# Patient Record
Sex: Female | Born: 1950 | Race: Black or African American | Hispanic: No | Marital: Married | Smoking: Never smoker
Health system: Southern US, Community
[De-identification: ages and names within clinical notes are randomized; demographics above are authoritative.]

## PROBLEM LIST (undated history)

## (undated) DIAGNOSIS — R0681 Apnea, not elsewhere classified: Secondary | ICD-10-CM

## (undated) DIAGNOSIS — I10 Essential (primary) hypertension: Secondary | ICD-10-CM

## (undated) DIAGNOSIS — R251 Tremor, unspecified: Secondary | ICD-10-CM

## (undated) HISTORY — DX: Tremor, unspecified: R25.1

## (undated) HISTORY — PX: APPENDECTOMY: SHX54

## (undated) HISTORY — DX: Essential (primary) hypertension: I10

## (undated) HISTORY — DX: Apnea, not elsewhere classified: R06.81

---

## 1985-06-23 HISTORY — PX: TOTAL ABDOMINAL HYSTERECTOMY: SHX209

## 2000-09-15 ENCOUNTER — Ambulatory Visit (HOSPITAL_COMMUNITY): Admission: RE | Admit: 2000-09-15 | Discharge: 2000-09-15 | Payer: Self-pay | Admitting: Cardiovascular Disease

## 2002-06-23 HISTORY — PX: MENISCUS REPAIR: SHX5179

## 2010-06-23 HISTORY — PX: OTHER SURGICAL HISTORY: SHX169

## 2014-03-13 DIAGNOSIS — M545 Low back pain, unspecified: Secondary | ICD-10-CM | POA: Diagnosis not present

## 2014-03-13 DIAGNOSIS — J309 Allergic rhinitis, unspecified: Secondary | ICD-10-CM | POA: Diagnosis not present

## 2014-03-30 DIAGNOSIS — G4733 Obstructive sleep apnea (adult) (pediatric): Secondary | ICD-10-CM | POA: Diagnosis not present

## 2014-05-25 DIAGNOSIS — M7551 Bursitis of right shoulder: Secondary | ICD-10-CM | POA: Diagnosis not present

## 2014-07-03 DIAGNOSIS — G4733 Obstructive sleep apnea (adult) (pediatric): Secondary | ICD-10-CM | POA: Diagnosis not present

## 2014-07-10 DIAGNOSIS — H40013 Open angle with borderline findings, low risk, bilateral: Secondary | ICD-10-CM | POA: Diagnosis not present

## 2014-07-10 DIAGNOSIS — H2513 Age-related nuclear cataract, bilateral: Secondary | ICD-10-CM | POA: Diagnosis not present

## 2014-07-27 DIAGNOSIS — H53433 Sector or arcuate defects, bilateral: Secondary | ICD-10-CM | POA: Diagnosis not present

## 2014-08-01 DIAGNOSIS — H4011X3 Primary open-angle glaucoma, severe stage: Secondary | ICD-10-CM | POA: Diagnosis not present

## 2014-09-21 DIAGNOSIS — Z1389 Encounter for screening for other disorder: Secondary | ICD-10-CM | POA: Diagnosis not present

## 2014-09-21 DIAGNOSIS — M79672 Pain in left foot: Secondary | ICD-10-CM | POA: Diagnosis not present

## 2014-09-21 DIAGNOSIS — M79671 Pain in right foot: Secondary | ICD-10-CM | POA: Diagnosis not present

## 2014-09-21 DIAGNOSIS — Z9181 History of falling: Secondary | ICD-10-CM | POA: Diagnosis not present

## 2014-12-18 DIAGNOSIS — E669 Obesity, unspecified: Secondary | ICD-10-CM | POA: Diagnosis not present

## 2014-12-18 DIAGNOSIS — Z23 Encounter for immunization: Secondary | ICD-10-CM | POA: Diagnosis not present

## 2014-12-18 DIAGNOSIS — I10 Essential (primary) hypertension: Secondary | ICD-10-CM | POA: Diagnosis not present

## 2014-12-18 DIAGNOSIS — E039 Hypothyroidism, unspecified: Secondary | ICD-10-CM | POA: Diagnosis not present

## 2014-12-18 DIAGNOSIS — G40309 Generalized idiopathic epilepsy and epileptic syndromes, not intractable, without status epilepticus: Secondary | ICD-10-CM | POA: Diagnosis not present

## 2014-12-18 DIAGNOSIS — G4733 Obstructive sleep apnea (adult) (pediatric): Secondary | ICD-10-CM | POA: Diagnosis not present

## 2014-12-18 DIAGNOSIS — Z79899 Other long term (current) drug therapy: Secondary | ICD-10-CM | POA: Diagnosis not present

## 2014-12-19 DIAGNOSIS — Z1211 Encounter for screening for malignant neoplasm of colon: Secondary | ICD-10-CM | POA: Diagnosis not present

## 2014-12-19 DIAGNOSIS — R1310 Dysphagia, unspecified: Secondary | ICD-10-CM | POA: Diagnosis not present

## 2014-12-19 DIAGNOSIS — K5900 Constipation, unspecified: Secondary | ICD-10-CM | POA: Diagnosis not present

## 2014-12-19 DIAGNOSIS — Z7982 Long term (current) use of aspirin: Secondary | ICD-10-CM | POA: Diagnosis not present

## 2014-12-20 DIAGNOSIS — D649 Anemia, unspecified: Secondary | ICD-10-CM | POA: Diagnosis not present

## 2014-12-20 DIAGNOSIS — Z1231 Encounter for screening mammogram for malignant neoplasm of breast: Secondary | ICD-10-CM | POA: Diagnosis not present

## 2014-12-22 DIAGNOSIS — C50919 Malignant neoplasm of unspecified site of unspecified female breast: Secondary | ICD-10-CM

## 2014-12-22 HISTORY — PX: BREAST BIOPSY: SHX20

## 2014-12-22 HISTORY — DX: Malignant neoplasm of unspecified site of unspecified female breast: C50.919

## 2014-12-28 DIAGNOSIS — Z803 Family history of malignant neoplasm of breast: Secondary | ICD-10-CM | POA: Diagnosis not present

## 2014-12-28 DIAGNOSIS — R920 Mammographic microcalcification found on diagnostic imaging of breast: Secondary | ICD-10-CM | POA: Diagnosis not present

## 2015-01-01 DIAGNOSIS — R921 Mammographic calcification found on diagnostic imaging of breast: Secondary | ICD-10-CM | POA: Diagnosis not present

## 2015-01-01 DIAGNOSIS — C50911 Malignant neoplasm of unspecified site of right female breast: Secondary | ICD-10-CM | POA: Diagnosis not present

## 2015-01-01 DIAGNOSIS — C50311 Malignant neoplasm of lower-inner quadrant of right female breast: Secondary | ICD-10-CM | POA: Diagnosis not present

## 2015-01-10 DIAGNOSIS — C50111 Malignant neoplasm of central portion of right female breast: Secondary | ICD-10-CM | POA: Diagnosis not present

## 2015-01-10 DIAGNOSIS — Z6841 Body Mass Index (BMI) 40.0 and over, adult: Secondary | ICD-10-CM | POA: Diagnosis not present

## 2015-01-22 DIAGNOSIS — R1310 Dysphagia, unspecified: Secondary | ICD-10-CM | POA: Diagnosis not present

## 2015-01-22 DIAGNOSIS — K2900 Acute gastritis without bleeding: Secondary | ICD-10-CM | POA: Diagnosis not present

## 2015-01-22 DIAGNOSIS — K319 Disease of stomach and duodenum, unspecified: Secondary | ICD-10-CM | POA: Diagnosis not present

## 2015-01-22 DIAGNOSIS — Z791 Long term (current) use of non-steroidal anti-inflammatories (NSAID): Secondary | ICD-10-CM | POA: Diagnosis not present

## 2015-01-22 DIAGNOSIS — Z1211 Encounter for screening for malignant neoplasm of colon: Secondary | ICD-10-CM | POA: Diagnosis not present

## 2015-02-07 DIAGNOSIS — C50911 Malignant neoplasm of unspecified site of right female breast: Secondary | ICD-10-CM | POA: Diagnosis not present

## 2015-02-07 DIAGNOSIS — D0511 Intraductal carcinoma in situ of right breast: Secondary | ICD-10-CM | POA: Diagnosis not present

## 2015-02-07 DIAGNOSIS — G4733 Obstructive sleep apnea (adult) (pediatric): Secondary | ICD-10-CM | POA: Diagnosis not present

## 2015-02-07 DIAGNOSIS — Z6841 Body Mass Index (BMI) 40.0 and over, adult: Secondary | ICD-10-CM | POA: Diagnosis not present

## 2015-02-07 DIAGNOSIS — J45909 Unspecified asthma, uncomplicated: Secondary | ICD-10-CM | POA: Diagnosis not present

## 2015-02-07 DIAGNOSIS — I493 Ventricular premature depolarization: Secondary | ICD-10-CM | POA: Diagnosis not present

## 2015-02-07 DIAGNOSIS — C50311 Malignant neoplasm of lower-inner quadrant of right female breast: Secondary | ICD-10-CM | POA: Diagnosis not present

## 2015-02-07 DIAGNOSIS — I10 Essential (primary) hypertension: Secondary | ICD-10-CM | POA: Diagnosis not present

## 2015-02-07 DIAGNOSIS — Z7982 Long term (current) use of aspirin: Secondary | ICD-10-CM | POA: Diagnosis not present

## 2015-02-07 DIAGNOSIS — E039 Hypothyroidism, unspecified: Secondary | ICD-10-CM | POA: Diagnosis not present

## 2015-02-07 DIAGNOSIS — Z79899 Other long term (current) drug therapy: Secondary | ICD-10-CM | POA: Diagnosis not present

## 2015-02-07 DIAGNOSIS — G4730 Sleep apnea, unspecified: Secondary | ICD-10-CM | POA: Diagnosis not present

## 2015-02-07 DIAGNOSIS — C50111 Malignant neoplasm of central portion of right female breast: Secondary | ICD-10-CM | POA: Diagnosis not present

## 2015-02-08 DIAGNOSIS — J45909 Unspecified asthma, uncomplicated: Secondary | ICD-10-CM | POA: Diagnosis not present

## 2015-02-08 DIAGNOSIS — I10 Essential (primary) hypertension: Secondary | ICD-10-CM | POA: Diagnosis not present

## 2015-02-08 DIAGNOSIS — C50311 Malignant neoplasm of lower-inner quadrant of right female breast: Secondary | ICD-10-CM | POA: Diagnosis not present

## 2015-02-08 DIAGNOSIS — G4733 Obstructive sleep apnea (adult) (pediatric): Secondary | ICD-10-CM | POA: Diagnosis not present

## 2015-02-08 DIAGNOSIS — E039 Hypothyroidism, unspecified: Secondary | ICD-10-CM | POA: Diagnosis not present

## 2015-02-28 DIAGNOSIS — C50111 Malignant neoplasm of central portion of right female breast: Secondary | ICD-10-CM | POA: Diagnosis not present

## 2015-03-02 DIAGNOSIS — C50111 Malignant neoplasm of central portion of right female breast: Secondary | ICD-10-CM | POA: Diagnosis not present

## 2015-03-07 DIAGNOSIS — C50111 Malignant neoplasm of central portion of right female breast: Secondary | ICD-10-CM | POA: Diagnosis not present

## 2015-03-07 DIAGNOSIS — Z510 Encounter for antineoplastic radiation therapy: Secondary | ICD-10-CM | POA: Diagnosis not present

## 2015-03-09 DIAGNOSIS — C50111 Malignant neoplasm of central portion of right female breast: Secondary | ICD-10-CM | POA: Diagnosis not present

## 2015-03-09 DIAGNOSIS — Z510 Encounter for antineoplastic radiation therapy: Secondary | ICD-10-CM | POA: Diagnosis not present

## 2015-03-12 DIAGNOSIS — C50111 Malignant neoplasm of central portion of right female breast: Secondary | ICD-10-CM | POA: Diagnosis not present

## 2015-03-13 DIAGNOSIS — C50111 Malignant neoplasm of central portion of right female breast: Secondary | ICD-10-CM | POA: Diagnosis not present

## 2015-03-13 DIAGNOSIS — Z510 Encounter for antineoplastic radiation therapy: Secondary | ICD-10-CM | POA: Diagnosis not present

## 2015-03-14 DIAGNOSIS — Z510 Encounter for antineoplastic radiation therapy: Secondary | ICD-10-CM | POA: Diagnosis not present

## 2015-03-14 DIAGNOSIS — C50111 Malignant neoplasm of central portion of right female breast: Secondary | ICD-10-CM | POA: Diagnosis not present

## 2015-03-15 DIAGNOSIS — Z510 Encounter for antineoplastic radiation therapy: Secondary | ICD-10-CM | POA: Diagnosis not present

## 2015-03-15 DIAGNOSIS — C50111 Malignant neoplasm of central portion of right female breast: Secondary | ICD-10-CM | POA: Diagnosis not present

## 2015-03-16 DIAGNOSIS — Z510 Encounter for antineoplastic radiation therapy: Secondary | ICD-10-CM | POA: Diagnosis not present

## 2015-03-16 DIAGNOSIS — C50111 Malignant neoplasm of central portion of right female breast: Secondary | ICD-10-CM | POA: Diagnosis not present

## 2015-03-19 DIAGNOSIS — C50111 Malignant neoplasm of central portion of right female breast: Secondary | ICD-10-CM | POA: Diagnosis not present

## 2015-03-19 DIAGNOSIS — Z510 Encounter for antineoplastic radiation therapy: Secondary | ICD-10-CM | POA: Diagnosis not present

## 2015-03-20 DIAGNOSIS — C50111 Malignant neoplasm of central portion of right female breast: Secondary | ICD-10-CM | POA: Diagnosis not present

## 2015-03-20 DIAGNOSIS — Z510 Encounter for antineoplastic radiation therapy: Secondary | ICD-10-CM | POA: Diagnosis not present

## 2015-03-21 DIAGNOSIS — Z510 Encounter for antineoplastic radiation therapy: Secondary | ICD-10-CM | POA: Diagnosis not present

## 2015-03-21 DIAGNOSIS — C50111 Malignant neoplasm of central portion of right female breast: Secondary | ICD-10-CM | POA: Diagnosis not present

## 2015-03-22 DIAGNOSIS — C50111 Malignant neoplasm of central portion of right female breast: Secondary | ICD-10-CM | POA: Diagnosis not present

## 2015-03-22 DIAGNOSIS — Z510 Encounter for antineoplastic radiation therapy: Secondary | ICD-10-CM | POA: Diagnosis not present

## 2015-03-23 DIAGNOSIS — Z510 Encounter for antineoplastic radiation therapy: Secondary | ICD-10-CM | POA: Diagnosis not present

## 2015-03-23 DIAGNOSIS — C50111 Malignant neoplasm of central portion of right female breast: Secondary | ICD-10-CM | POA: Diagnosis not present

## 2015-03-26 DIAGNOSIS — Z510 Encounter for antineoplastic radiation therapy: Secondary | ICD-10-CM | POA: Diagnosis not present

## 2015-03-26 DIAGNOSIS — C50111 Malignant neoplasm of central portion of right female breast: Secondary | ICD-10-CM | POA: Diagnosis not present

## 2015-03-27 DIAGNOSIS — C50111 Malignant neoplasm of central portion of right female breast: Secondary | ICD-10-CM | POA: Diagnosis not present

## 2015-03-27 DIAGNOSIS — Z510 Encounter for antineoplastic radiation therapy: Secondary | ICD-10-CM | POA: Diagnosis not present

## 2015-03-28 DIAGNOSIS — Z510 Encounter for antineoplastic radiation therapy: Secondary | ICD-10-CM | POA: Diagnosis not present

## 2015-03-28 DIAGNOSIS — C50111 Malignant neoplasm of central portion of right female breast: Secondary | ICD-10-CM | POA: Diagnosis not present

## 2015-03-29 DIAGNOSIS — Z510 Encounter for antineoplastic radiation therapy: Secondary | ICD-10-CM | POA: Diagnosis not present

## 2015-03-29 DIAGNOSIS — C50111 Malignant neoplasm of central portion of right female breast: Secondary | ICD-10-CM | POA: Diagnosis not present

## 2015-03-30 DIAGNOSIS — Z510 Encounter for antineoplastic radiation therapy: Secondary | ICD-10-CM | POA: Diagnosis not present

## 2015-03-30 DIAGNOSIS — C50111 Malignant neoplasm of central portion of right female breast: Secondary | ICD-10-CM | POA: Diagnosis not present

## 2015-04-02 DIAGNOSIS — C50111 Malignant neoplasm of central portion of right female breast: Secondary | ICD-10-CM | POA: Diagnosis not present

## 2015-04-02 DIAGNOSIS — Z510 Encounter for antineoplastic radiation therapy: Secondary | ICD-10-CM | POA: Diagnosis not present

## 2015-04-03 DIAGNOSIS — Z510 Encounter for antineoplastic radiation therapy: Secondary | ICD-10-CM | POA: Diagnosis not present

## 2015-04-03 DIAGNOSIS — C50111 Malignant neoplasm of central portion of right female breast: Secondary | ICD-10-CM | POA: Diagnosis not present

## 2015-04-04 DIAGNOSIS — H40013 Open angle with borderline findings, low risk, bilateral: Secondary | ICD-10-CM | POA: Diagnosis not present

## 2015-04-04 DIAGNOSIS — Z510 Encounter for antineoplastic radiation therapy: Secondary | ICD-10-CM | POA: Diagnosis not present

## 2015-04-04 DIAGNOSIS — C50111 Malignant neoplasm of central portion of right female breast: Secondary | ICD-10-CM | POA: Diagnosis not present

## 2015-04-05 DIAGNOSIS — C50111 Malignant neoplasm of central portion of right female breast: Secondary | ICD-10-CM | POA: Diagnosis not present

## 2015-04-05 DIAGNOSIS — Z510 Encounter for antineoplastic radiation therapy: Secondary | ICD-10-CM | POA: Diagnosis not present

## 2015-04-06 DIAGNOSIS — C50111 Malignant neoplasm of central portion of right female breast: Secondary | ICD-10-CM | POA: Diagnosis not present

## 2015-04-06 DIAGNOSIS — Z510 Encounter for antineoplastic radiation therapy: Secondary | ICD-10-CM | POA: Diagnosis not present

## 2015-04-09 DIAGNOSIS — Z510 Encounter for antineoplastic radiation therapy: Secondary | ICD-10-CM | POA: Diagnosis not present

## 2015-04-09 DIAGNOSIS — C50111 Malignant neoplasm of central portion of right female breast: Secondary | ICD-10-CM | POA: Diagnosis not present

## 2015-04-10 DIAGNOSIS — Z510 Encounter for antineoplastic radiation therapy: Secondary | ICD-10-CM | POA: Diagnosis not present

## 2015-04-10 DIAGNOSIS — C50111 Malignant neoplasm of central portion of right female breast: Secondary | ICD-10-CM | POA: Diagnosis not present

## 2015-04-11 DIAGNOSIS — Z510 Encounter for antineoplastic radiation therapy: Secondary | ICD-10-CM | POA: Diagnosis not present

## 2015-04-11 DIAGNOSIS — C50111 Malignant neoplasm of central portion of right female breast: Secondary | ICD-10-CM | POA: Diagnosis not present

## 2015-04-12 DIAGNOSIS — Z510 Encounter for antineoplastic radiation therapy: Secondary | ICD-10-CM | POA: Diagnosis not present

## 2015-04-12 DIAGNOSIS — C50111 Malignant neoplasm of central portion of right female breast: Secondary | ICD-10-CM | POA: Diagnosis not present

## 2015-04-13 DIAGNOSIS — C50111 Malignant neoplasm of central portion of right female breast: Secondary | ICD-10-CM | POA: Diagnosis not present

## 2015-04-13 DIAGNOSIS — Z510 Encounter for antineoplastic radiation therapy: Secondary | ICD-10-CM | POA: Diagnosis not present

## 2015-04-16 DIAGNOSIS — C50111 Malignant neoplasm of central portion of right female breast: Secondary | ICD-10-CM | POA: Diagnosis not present

## 2015-04-16 DIAGNOSIS — Z510 Encounter for antineoplastic radiation therapy: Secondary | ICD-10-CM | POA: Diagnosis not present

## 2015-04-17 DIAGNOSIS — Z510 Encounter for antineoplastic radiation therapy: Secondary | ICD-10-CM | POA: Diagnosis not present

## 2015-04-17 DIAGNOSIS — C50111 Malignant neoplasm of central portion of right female breast: Secondary | ICD-10-CM | POA: Diagnosis not present

## 2015-04-18 DIAGNOSIS — C50111 Malignant neoplasm of central portion of right female breast: Secondary | ICD-10-CM | POA: Diagnosis not present

## 2015-04-18 DIAGNOSIS — Z510 Encounter for antineoplastic radiation therapy: Secondary | ICD-10-CM | POA: Diagnosis not present

## 2015-04-20 DIAGNOSIS — C50111 Malignant neoplasm of central portion of right female breast: Secondary | ICD-10-CM | POA: Diagnosis not present

## 2015-04-20 DIAGNOSIS — Z510 Encounter for antineoplastic radiation therapy: Secondary | ICD-10-CM | POA: Diagnosis not present

## 2015-04-23 DIAGNOSIS — C50111 Malignant neoplasm of central portion of right female breast: Secondary | ICD-10-CM | POA: Diagnosis not present

## 2015-04-23 DIAGNOSIS — Z510 Encounter for antineoplastic radiation therapy: Secondary | ICD-10-CM | POA: Diagnosis not present

## 2015-04-24 DIAGNOSIS — Z510 Encounter for antineoplastic radiation therapy: Secondary | ICD-10-CM | POA: Diagnosis not present

## 2015-04-24 DIAGNOSIS — C50111 Malignant neoplasm of central portion of right female breast: Secondary | ICD-10-CM | POA: Diagnosis not present

## 2015-05-24 DIAGNOSIS — C50919 Malignant neoplasm of unspecified site of unspecified female breast: Secondary | ICD-10-CM | POA: Diagnosis not present

## 2015-05-24 DIAGNOSIS — D0511 Intraductal carcinoma in situ of right breast: Secondary | ICD-10-CM | POA: Diagnosis not present

## 2015-05-24 DIAGNOSIS — Z808 Family history of malignant neoplasm of other organs or systems: Secondary | ICD-10-CM | POA: Diagnosis not present

## 2015-05-24 DIAGNOSIS — Z803 Family history of malignant neoplasm of breast: Secondary | ICD-10-CM | POA: Diagnosis not present

## 2015-07-02 DIAGNOSIS — Z853 Personal history of malignant neoplasm of breast: Secondary | ICD-10-CM | POA: Diagnosis not present

## 2015-07-02 DIAGNOSIS — C50919 Malignant neoplasm of unspecified site of unspecified female breast: Secondary | ICD-10-CM | POA: Diagnosis not present

## 2015-07-02 DIAGNOSIS — Z79811 Long term (current) use of aromatase inhibitors: Secondary | ICD-10-CM | POA: Diagnosis not present

## 2015-07-24 DIAGNOSIS — R202 Paresthesia of skin: Secondary | ICD-10-CM | POA: Diagnosis not present

## 2015-07-24 DIAGNOSIS — M722 Plantar fascial fibromatosis: Secondary | ICD-10-CM | POA: Diagnosis not present

## 2015-08-10 DIAGNOSIS — J029 Acute pharyngitis, unspecified: Secondary | ICD-10-CM | POA: Diagnosis not present

## 2015-08-10 DIAGNOSIS — H6121 Impacted cerumen, right ear: Secondary | ICD-10-CM | POA: Diagnosis not present

## 2015-08-21 DIAGNOSIS — Z6841 Body Mass Index (BMI) 40.0 and over, adult: Secondary | ICD-10-CM | POA: Diagnosis not present

## 2015-08-21 DIAGNOSIS — C50111 Malignant neoplasm of central portion of right female breast: Secondary | ICD-10-CM | POA: Insufficient documentation

## 2015-08-21 DIAGNOSIS — Z170 Estrogen receptor positive status [ER+]: Secondary | ICD-10-CM | POA: Insufficient documentation

## 2015-10-30 DIAGNOSIS — M19072 Primary osteoarthritis, left ankle and foot: Secondary | ICD-10-CM | POA: Diagnosis not present

## 2015-10-30 DIAGNOSIS — M6701 Short Achilles tendon (acquired), right ankle: Secondary | ICD-10-CM | POA: Insufficient documentation

## 2015-10-30 DIAGNOSIS — M19071 Primary osteoarthritis, right ankle and foot: Secondary | ICD-10-CM | POA: Diagnosis not present

## 2015-10-30 DIAGNOSIS — M6702 Short Achilles tendon (acquired), left ankle: Secondary | ICD-10-CM | POA: Diagnosis not present

## 2015-10-30 DIAGNOSIS — M722 Plantar fascial fibromatosis: Secondary | ICD-10-CM | POA: Diagnosis not present

## 2015-11-01 DIAGNOSIS — Z170 Estrogen receptor positive status [ER+]: Secondary | ICD-10-CM | POA: Diagnosis not present

## 2015-11-01 DIAGNOSIS — C50919 Malignant neoplasm of unspecified site of unspecified female breast: Secondary | ICD-10-CM | POA: Diagnosis not present

## 2015-11-01 DIAGNOSIS — Z79811 Long term (current) use of aromatase inhibitors: Secondary | ICD-10-CM | POA: Diagnosis not present

## 2015-11-01 DIAGNOSIS — Z853 Personal history of malignant neoplasm of breast: Secondary | ICD-10-CM | POA: Diagnosis not present

## 2015-12-04 DIAGNOSIS — M722 Plantar fascial fibromatosis: Secondary | ICD-10-CM | POA: Diagnosis not present

## 2015-12-04 DIAGNOSIS — M19071 Primary osteoarthritis, right ankle and foot: Secondary | ICD-10-CM | POA: Diagnosis not present

## 2015-12-04 DIAGNOSIS — M6701 Short Achilles tendon (acquired), right ankle: Secondary | ICD-10-CM | POA: Diagnosis not present

## 2015-12-04 DIAGNOSIS — M19072 Primary osteoarthritis, left ankle and foot: Secondary | ICD-10-CM | POA: Diagnosis not present

## 2015-12-04 DIAGNOSIS — M6702 Short Achilles tendon (acquired), left ankle: Secondary | ICD-10-CM | POA: Diagnosis not present

## 2015-12-24 DIAGNOSIS — M8589 Other specified disorders of bone density and structure, multiple sites: Secondary | ICD-10-CM | POA: Diagnosis not present

## 2015-12-24 DIAGNOSIS — Z9889 Other specified postprocedural states: Secondary | ICD-10-CM | POA: Diagnosis not present

## 2015-12-24 DIAGNOSIS — C50111 Malignant neoplasm of central portion of right female breast: Secondary | ICD-10-CM | POA: Diagnosis not present

## 2015-12-24 DIAGNOSIS — Z13820 Encounter for screening for osteoporosis: Secondary | ICD-10-CM | POA: Diagnosis not present

## 2015-12-24 DIAGNOSIS — Z853 Personal history of malignant neoplasm of breast: Secondary | ICD-10-CM | POA: Diagnosis not present

## 2016-01-08 DIAGNOSIS — Z170 Estrogen receptor positive status [ER+]: Secondary | ICD-10-CM | POA: Diagnosis not present

## 2016-01-08 DIAGNOSIS — C50111 Malignant neoplasm of central portion of right female breast: Secondary | ICD-10-CM | POA: Diagnosis not present

## 2016-01-08 DIAGNOSIS — Z6841 Body Mass Index (BMI) 40.0 and over, adult: Secondary | ICD-10-CM | POA: Insufficient documentation

## 2016-02-06 DIAGNOSIS — G40309 Generalized idiopathic epilepsy and epileptic syndromes, not intractable, without status epilepticus: Secondary | ICD-10-CM | POA: Diagnosis not present

## 2016-02-06 DIAGNOSIS — Z79899 Other long term (current) drug therapy: Secondary | ICD-10-CM | POA: Diagnosis not present

## 2016-02-06 DIAGNOSIS — E039 Hypothyroidism, unspecified: Secondary | ICD-10-CM | POA: Diagnosis not present

## 2016-02-06 DIAGNOSIS — R32 Unspecified urinary incontinence: Secondary | ICD-10-CM | POA: Diagnosis not present

## 2016-02-06 DIAGNOSIS — I10 Essential (primary) hypertension: Secondary | ICD-10-CM | POA: Diagnosis not present

## 2016-02-06 DIAGNOSIS — Z0000 Encounter for general adult medical examination without abnormal findings: Secondary | ICD-10-CM | POA: Diagnosis not present

## 2016-02-06 DIAGNOSIS — Z23 Encounter for immunization: Secondary | ICD-10-CM | POA: Diagnosis not present

## 2016-02-07 DIAGNOSIS — R32 Unspecified urinary incontinence: Secondary | ICD-10-CM | POA: Diagnosis not present

## 2016-03-03 DIAGNOSIS — Z853 Personal history of malignant neoplasm of breast: Secondary | ICD-10-CM | POA: Diagnosis not present

## 2016-03-03 DIAGNOSIS — Z79811 Long term (current) use of aromatase inhibitors: Secondary | ICD-10-CM

## 2016-03-03 DIAGNOSIS — C50111 Malignant neoplasm of central portion of right female breast: Secondary | ICD-10-CM

## 2016-03-03 DIAGNOSIS — Z170 Estrogen receptor positive status [ER+]: Secondary | ICD-10-CM

## 2016-04-03 DIAGNOSIS — K2900 Acute gastritis without bleeding: Secondary | ICD-10-CM | POA: Diagnosis not present

## 2016-04-03 DIAGNOSIS — K5900 Constipation, unspecified: Secondary | ICD-10-CM | POA: Diagnosis not present

## 2016-05-20 DIAGNOSIS — S93491A Sprain of other ligament of right ankle, initial encounter: Secondary | ICD-10-CM | POA: Diagnosis not present

## 2016-05-20 DIAGNOSIS — I10 Essential (primary) hypertension: Secondary | ICD-10-CM | POA: Diagnosis not present

## 2016-05-28 ENCOUNTER — Ambulatory Visit (INDEPENDENT_AMBULATORY_CARE_PROVIDER_SITE_OTHER): Payer: Medicare Other

## 2016-05-28 ENCOUNTER — Encounter: Payer: Self-pay | Admitting: Sports Medicine

## 2016-05-28 ENCOUNTER — Ambulatory Visit (INDEPENDENT_AMBULATORY_CARE_PROVIDER_SITE_OTHER): Payer: Medicare Other | Admitting: Sports Medicine

## 2016-05-28 DIAGNOSIS — M779 Enthesopathy, unspecified: Secondary | ICD-10-CM | POA: Diagnosis not present

## 2016-05-28 DIAGNOSIS — M2142 Flat foot [pes planus] (acquired), left foot: Secondary | ICD-10-CM | POA: Diagnosis not present

## 2016-05-28 DIAGNOSIS — M25571 Pain in right ankle and joints of right foot: Secondary | ICD-10-CM

## 2016-05-28 DIAGNOSIS — M2141 Flat foot [pes planus] (acquired), right foot: Secondary | ICD-10-CM | POA: Diagnosis not present

## 2016-05-28 DIAGNOSIS — M19071 Primary osteoarthritis, right ankle and foot: Secondary | ICD-10-CM | POA: Diagnosis not present

## 2016-05-28 DIAGNOSIS — M25532 Pain in left wrist: Secondary | ICD-10-CM | POA: Diagnosis not present

## 2016-05-28 MED ORDER — METHYLPREDNISOLONE 4 MG PO TBPK: ORAL_TABLET | ORAL | 0 refills | Status: DC

## 2016-05-28 MED ORDER — TRIAMCINOLONE ACETONIDE 40 MG/ML IJ SUSP: 20.0000 mg | Freq: Once | INTRAMUSCULAR | Status: AC

## 2016-05-28 NOTE — Progress Notes (Signed)
Subjective:  Kim Nielsen is a 65 y.o. Obese female patient who presents to office for evaluation of right ankle pain. Patient complains of continued pain in the ankle x 3 weeks. Patient has tried ice and tylenol with no relief in symptoms. Patient denies any other pedal complaints. Denies injury/trip/fall/sprain/any causative factors.   Patient Active Problem List   Diagnosis Date Noted  . BMI 40.0-44.9, adult ([place]) 01/08/2016  . Plantar fasciitis 10/30/2015  . Primary osteoarthritis of both feet 10/30/2015  . Tightness of left heel cord 10/30/2015  . Tightness of right heel cord 10/30/2015  . BMI 45.0-49.9, adult ([place]) 08/21/2015  . Cancer of central portion of right female breast ([place]) 08/21/2015  . Estrogen receptor positive tumor status 08/21/2015  . Morbid obesity ([place]) 08/21/2015    No current outpatient prescriptions on file prior to visit.   No current facility-administered medications on file prior to visit.     Allergies  Allergen Reactions  . Oxycodone     Objective:  General: Alert and oriented x3 in no acute distress  Dermatology: No open lesions bilateral lower extremities, no webspace macerations, no ecchymosis bilateral, all nails x 10 are well manicured.  Vascular: Dorsalis Pedis and Posterior Tibial pedal pulses palpable, Capillary Fill Time 3 seconds,(+) pedal hair growth bilateral, + edema and excessive fat bilateral lower extremities, Temperature gradient within normal limits.  Neurology: Gross sensation intact via light touch bilateral. (- )Tinels sign right.   Musculoskeletal: Mild tenderness with palpation at right ankle at medial and lateral ankle gutters. Negative talar tilt, Negative tib-fib stress, No instability. Severe pes planus with lateral ankle impingement right>left.  No pain with calf compression bilateral. Range of motion limited with mild guarding on right ankle. Strength within normal limits in all groups bilateral.   Gait: Antalgic  gait  Xrays  RIght Ankle   Impression: Severe arthritis, joint space narrowing, pes planus.   Assessment and Plan: Problem List Items Addressed This Visit    None    Visit Diagnoses    Capsulitis    -  Primary   Relevant Medications   methylPREDNISolone (MEDROL DOSEPAK) 4 MG TBPK tablet   triamcinolone acetonide (KENALOG-40) injection 20 mg   Right ankle pain, unspecified chronicity       Relevant Medications   methylPREDNISolone (MEDROL DOSEPAK) 4 MG TBPK tablet   triamcinolone acetonide (KENALOG-40) injection 20 mg   Other Relevant Orders   DG Ankle Complete Right   Primary osteoarthritis of right ankle       Relevant Medications   anastrozole (ARIMIDEX) 1 MG tablet   anastrozole (ARIMIDEX) 1 MG tablet   aspirin EC 81 MG tablet   meloxicam (MOBIC) 7.5 MG tablet   methylPREDNISolone (MEDROL DOSEPAK) 4 MG TBPK tablet   triamcinolone acetonide (KENALOG-40) injection 20 mg   Pes planus of both feet       Relevant Medications   methylPREDNISolone (MEDROL DOSEPAK) 4 MG TBPK tablet      -Complete examination performed -Xrays reviewed -Discussed treatement options for arthritis and capsulitis -After oral consent and aseptic prep, injected a mixture containing 1 ml of 2% plain lidocaine, 1 ml 0.5% plain marcaine, 0.5 ml of kenalog 10 and 0.5 ml of dexamethasone phosphate into right ankle at area of max pain lateral aspect without complication. Post-injection care discussed with patient.  -Rx Medrol dose pack -Advised Tylenol arthritis, rest, ice, elevation -Continue with good supportive shoes -In future may benefit from UCBL or AFO type brace -Patient to return to  office in 4 weeks or sooner if condition worsens.  Landis Martins, DPM

## 2016-05-28 NOTE — Patient Instructions (Signed)
Ankle Pain Many things can cause ankle pain, including an injury to the area and overuse of the ankle.The ankle joint holds your body weight and allows you to move around. Ankle pain can occur on either side or the back of one ankle or both ankles. Ankle pain may be sharp and burning or dull and aching. There may be tenderness, stiffness, redness, or warmth around the ankle. Follow these instructions at home: Activity  Rest your ankle as told by your health care provider. Avoid any activities that cause ankle pain.  Do exercises as told by your health care provider.  Ask your health care provider if you can drive. Using a brace, a bandage, or crutches  If you were given a brace:  Wear it as told by your health care provider.  Remove it when you take a bath or a shower.  Try not to move your ankle very much, but wiggle your toes from time to time. This helps to prevent swelling.  If you were given an elastic bandage:  Remove it when you take a bath or a shower.  Try not to move your ankle very much, but wiggle your toes from time to time. This helps to prevent swelling.  Adjust the bandage to make it more comfortable if it feels too tight.  Loosen the bandage if you have numbness or tingling in your foot or if your foot turns cold and blue.  If you have crutches, use them as told by your health care provider. Continue to use them until you can walk without feeling pain in your ankle. Managing pain, stiffness, and swelling  Raise (elevate) your ankle above the level of your heart while you are sitting or lying down.  If directed, apply ice to the area:  Put ice in a plastic bag.  Place a towel between your skin and the bag.  Leave the ice on for 20 minutes, 2-3 times per day. General instructions  Keep all follow-up visits as told by your health care provider. This is important.  Record this information that may be helpful for you and your health care provider:  How  often you have ankle pain.  Where the pain is located.  What the pain feels like.  Take over-the-counter and prescription medicines only as told by your health care provider. Contact a health care provider if:  Your pain gets worse.  Your pain is not relieved with medicines.  You have a fever or chills.  You are having more trouble with walking.  You have new symptoms. Get help right away if:  Your foot, leg, toes, or ankle tingles or becomes numb.  Your foot, leg, toes, or ankle becomes swollen.  Your foot, leg, toes, or ankle turns pale or blue. This information is not intended to replace advice given to you by your health care provider. Make sure you discuss any questions you have with your health care provider. Document Released: 11/27/2009 Document Revised: 02/08/2016 Document Reviewed: 01/09/2015 Elsevier Interactive Patient Education  2017 [employer] Flat feet, also called fallen arches, are feet that do not have a curve (arch) on the inner side of the foot. Normally, an arch develops during childhood and creates a gap between the foot and the ground. Flat feet rest entirely on the ground, without a gap. In some cases, an arch never develops. In other cases, an arch develops and later collapses.  This is a common condition that can affect one foot  or both feet. CAUSES This condition may be caused by:  Failure of normal arch development during childhood.  Injury to connective tissues (tendons and ligaments) in the foot, such as the tendon that supports the arch (posterior tibial tendon).  Loose tendons or ligaments in the foot.  A wearing down of the arch over time.  Injury to bones in the foot.  Abnormality in the bones of the foot (tarsal coalition). This is when two or more bones in the foot are joined together (fused) during development before birth. This limits foot movement and can cause flat feet. RISK FACTORS This condition is more common  in females. It is also more likely to develop in people who:  Are 40 years or older.  Have a family history of flat feet.  Have a history of childhood flexible flatfoot. This is a common childhood condition in which the arch disappears when a child is standing but is present when a child is sitting or standing on tiptoes.  Are obese.  Have diabetes.  Have high blood pressure.  Participate in high-impact sports that can damage the posterior tibial tendon.  Have inflammatory arthritis.  Have a history of broken (fractured) or dislocated bones in the foot. SYMPTOMS Symptoms of this condition may include:  Pain or tightness along the bottom of the foot.  Foot pain that gets worse with activity.  Swelling of the inner foot.  Swelling of the ankle.  Pain on the outside of the ankle.  Changes in the way you walk (gait).  Pronation. This is when the foot and ankle lean inward when standing.  Bony bumps on the top or inside of the foot. DIAGNOSIS This condition is diagnosed with a physical exam of your foot and ankle. Your health care provider may look at your shoes for patterns of wear on the soles. You may also have tests, including X-rays, CT scan, or MRI. You may be referred to a health care provider who specializes in feet (podiatrist) or a physical therapist. TREATMENT Treatment may include one or more of the following:  Stretching exercises or physical therapy to lengthen the heel tendon (Achilles tendon), increase range of motion, and relieve pain.  Shoe inserts (orthotics) to support the arches of your feet. These can be purchased from a store or they can be custom-made by your health care provider.  Wearing shoes with appropriate arch support. This is especially important for athletes. Your health care provider may recommend shoes for you to wear.  Medicines to relieve pain.  An ankle brace, boot, or cast to relieve pressure on your foot. You may be given crutches  if walking is painful.  Surgery to improve alignment of your foot. This is only needed if your posterior tibial tendon is torn, or if you have tarsal coalition. HOME CARE INSTRUCTIONS  Take over-the-counter and prescription medicines only as told by your health care provider.  Wear orthotics and appropriate shoes as told by your health care provider.  Rest your feet and avoid or change activities that cause pain.  Perform exercises to strengthen and stretch your feet as told by your health care provider or physical therapist.  If you have nerve damage from diabetes, check your feet daily for swelling, sores, blisters, or calluses. PREVENTION Flat feet cannot always be prevented. However, you can take these actions to help reduce your chance of developing flat feet or to help prevent your current condition from getting worse:  Wear comfortable, supportive shoes that are appropriate  for your activities.  Maintain a healthy weight.  Stay active, as told by your health care provider. This will help to keep your feet flexible and strong.  Manage long-term (chronic) health conditions, such as diabetes, high blood pressure, and inflammatory arthritis, if this applies.  Work with a health care provider if you have concerns about your feet or your shoes. SEEK MEDICAL CARE IF:  You have pain in your foot or lower leg that gets worse or does not improve with medicine.  You have pain or difficulty when walking.  You have problems with your orthotics or your shoes. This information is not intended to replace advice given to you by your health care provider. Make sure you discuss any questions you have with your health care provider. Document Released: 04/06/2009 Document Revised: 10/01/2015 Document Reviewed: 12/06/2014 Elsevier Interactive Patient Education  2017 [employer].

## 2016-06-04 ENCOUNTER — Ambulatory Visit: Payer: Medicare Other | Admitting: Sports Medicine

## 2016-06-25 ENCOUNTER — Ambulatory Visit: Payer: Medicare Other | Admitting: Sports Medicine

## 2016-06-25 ENCOUNTER — Encounter: Payer: Self-pay | Admitting: Sports Medicine

## 2016-06-25 ENCOUNTER — Ambulatory Visit (INDEPENDENT_AMBULATORY_CARE_PROVIDER_SITE_OTHER): Payer: Medicare Other | Admitting: Sports Medicine

## 2016-06-25 DIAGNOSIS — M2141 Flat foot [pes planus] (acquired), right foot: Secondary | ICD-10-CM

## 2016-06-25 DIAGNOSIS — M2142 Flat foot [pes planus] (acquired), left foot: Secondary | ICD-10-CM

## 2016-06-25 DIAGNOSIS — M19071 Primary osteoarthritis, right ankle and foot: Secondary | ICD-10-CM

## 2016-06-25 DIAGNOSIS — M25571 Pain in right ankle and joints of right foot: Secondary | ICD-10-CM | POA: Diagnosis not present

## 2016-06-25 DIAGNOSIS — M779 Enthesopathy, unspecified: Secondary | ICD-10-CM | POA: Diagnosis not present

## 2016-06-25 NOTE — Progress Notes (Signed)
Subjective:  Kim Nielsen is a 66 y.o. Obese female patient who returns to office for follow up evaluation of right ankle pain after injection 1 month ago. Reports no pain and that her ankle feels better. Desires to discuss shoes.   Patient Active Problem List   Diagnosis Date Noted  . BMI 40.0-44.9, adult ([place]) 01/08/2016  . Plantar fasciitis 10/30/2015  . Primary osteoarthritis of both feet 10/30/2015  . Tightness of left heel cord 10/30/2015  . Tightness of right heel cord 10/30/2015  . BMI 45.0-49.9, adult ([place]) 08/21/2015  . Cancer of central portion of right female breast ([place]) 08/21/2015  . Estrogen receptor positive tumor status 08/21/2015  . Morbid obesity ([place]) 08/21/2015    Current Outpatient Prescriptions on File Prior to Visit  Medication Sig Dispense Refill  . anastrozole (ARIMIDEX) 1 MG tablet     . anastrozole (ARIMIDEX) 1 MG tablet Take 1 mg by mouth.    Marland Kitchen aspirin EC 81 MG tablet Take by mouth.    . cetirizine (ZYRTEC) 10 MG tablet Take 10 mg by mouth.    . Cholecalciferol (VITAMIN D3) 2000 units capsule Take by mouth.    . divalproex (DEPAKOTE) 500 MG DR tablet     . fluticasone (FLONASE) 50 MCG/ACT nasal spray USE 2 SPRAYS IN EACH NOSTRIL EVERY DAY    . fluticasone (FLONASE) 50 MCG/ACT nasal spray     . furosemide (LASIX) 40 MG tablet TAKE 1 TABLET BY MOUTH EVERY DAY    . gabapentin (NEURONTIN) 300 MG capsule     . gabapentin (NEURONTIN) 300 MG capsule TAKE ONE CAPSULE BY MOUTH 3 TIMES A DAY    . latanoprost (XALATAN) 0.005 % ophthalmic solution     . latanoprost (XALATAN) 0.005 % ophthalmic solution INSTILL 1 DROP IN INTO BOTH EYES AT BEDTIME    . levothyroxine (SYNTHROID) 50 MCG tablet TAKE 1 TABLET DAILY    . losartan-hydrochlorothiazide (HYZAAR) 100-12.5 MG tablet TAKE 1 TABLET EVERY MORNING    . meloxicam (MOBIC) 7.5 MG tablet     . methylPREDNISolone (MEDROL DOSEPAK) 4 MG TBPK tablet Take as instructed 21 tablet 0  . Multiple Vitamin (MULTIVITAMIN)  capsule Take by mouth.    . pantoprazole (PROTONIX) 40 MG tablet TAKE 1 TABLET BY MOUTH EVERY DAY    . pantoprazole (PROTONIX) 40 MG tablet     . potassium chloride SA (KLOR-CON M20) 20 MEQ tablet TAKE 1 TABLET DAILY    . SYNTHROID 50 MCG tablet     . VITAMIN A PALMITATE PO Take by mouth.    . vitamin E 400 UNIT capsule Take by mouth.     Current Facility-Administered Medications on File Prior to Visit  Medication Dose Route Frequency Provider Last Rate Last Dose  . triamcinolone acetonide (KENALOG-40) injection 20 mg  20 mg Other Once Landis Martins, DPM        Allergies  Allergen Reactions  . Oxycodone     Objective:  General: Alert and oriented x3 in no acute distress  Dermatology: No open lesions bilateral lower extremities, no webspace macerations, no ecchymosis bilateral, all nails x 10 are well manicured.  Vascular: Dorsalis Pedis and Posterior Tibial pedal pulses palpable, Capillary Fill Time 3 seconds,(+) pedal hair growth bilateral, + edema and excessive fat bilateral lower extremities, Temperature gradient within normal limits.  Neurology: Gross sensation intact via light touch bilateral. (- )Tinels sign right.   Musculoskeletal: No tenderness with palpation at right ankle at medial and lateral ankle gutters. Negative  talar tilt, Negative tib-fib stress, No instability. Severe pes planus with lateral ankle impingement right>left.  No pain with calf compression bilateral. Range of motion limited with mild guarding on right ankle. Strength within normal limits in all groups bilateral.  Assessment and Plan: Problem List Items Addressed This Visit    None    Visit Diagnoses    Right ankle pain, unspecified chronicity    -  Primary   Primary osteoarthritis of right ankle       Pes planus of both feet       Capsulitis       resolved      -Complete examination performed -Continue with good supportive shoes; shoe recs given -Advised Tylenol arthritis, rest, ice,  elevation if needed for flare up -In future may benefit from UCBL or AFO type brace; patient to call when ready to have orthotic or brace made -Patient to return to office as needed or sooner if condition worsens.  Landis Martins, DPM

## 2016-06-25 NOTE — Patient Instructions (Signed)
For tennis shoes recommend:  Kim Nielsen * Ascis New balance * [place] Can be purchased at [employer] sports or [employer]  Vionic  * SAS Can be purchased at [employer] or [employer]   For work shoes recommend: [employer] Work [employer]  Can be purchased at a variety of places or Engineer, maintenance (IT)   For casual shoes recommend: Vionic * Can be purchased at [employer] or [employer]

## 2016-07-03 DIAGNOSIS — C50919 Malignant neoplasm of unspecified site of unspecified female breast: Secondary | ICD-10-CM | POA: Diagnosis not present

## 2016-07-03 DIAGNOSIS — Z170 Estrogen receptor positive status [ER+]: Secondary | ICD-10-CM | POA: Diagnosis not present

## 2016-07-03 DIAGNOSIS — Z853 Personal history of malignant neoplasm of breast: Secondary | ICD-10-CM | POA: Diagnosis not present

## 2016-07-03 DIAGNOSIS — Z79811 Long term (current) use of aromatase inhibitors: Secondary | ICD-10-CM | POA: Diagnosis not present

## 2016-07-08 DIAGNOSIS — R05 Cough: Secondary | ICD-10-CM | POA: Diagnosis not present

## 2016-10-30 DIAGNOSIS — Z79811 Long term (current) use of aromatase inhibitors: Secondary | ICD-10-CM | POA: Diagnosis not present

## 2016-10-30 DIAGNOSIS — Z853 Personal history of malignant neoplasm of breast: Secondary | ICD-10-CM | POA: Diagnosis not present

## 2016-10-30 DIAGNOSIS — C50919 Malignant neoplasm of unspecified site of unspecified female breast: Secondary | ICD-10-CM | POA: Diagnosis not present

## 2016-10-30 DIAGNOSIS — Z170 Estrogen receptor positive status [ER+]: Secondary | ICD-10-CM | POA: Diagnosis not present

## 2016-10-30 DIAGNOSIS — Z923 Personal history of irradiation: Secondary | ICD-10-CM | POA: Diagnosis not present

## 2016-11-07 ENCOUNTER — Ambulatory Visit (INDEPENDENT_AMBULATORY_CARE_PROVIDER_SITE_OTHER): Payer: Medicare Other | Admitting: Sports Medicine

## 2016-11-07 ENCOUNTER — Encounter: Payer: Self-pay | Admitting: Sports Medicine

## 2016-11-07 DIAGNOSIS — M2142 Flat foot [pes planus] (acquired), left foot: Secondary | ICD-10-CM

## 2016-11-07 DIAGNOSIS — M79671 Pain in right foot: Secondary | ICD-10-CM

## 2016-11-07 DIAGNOSIS — M79672 Pain in left foot: Secondary | ICD-10-CM | POA: Diagnosis not present

## 2016-11-07 DIAGNOSIS — M19071 Primary osteoarthritis, right ankle and foot: Secondary | ICD-10-CM

## 2016-11-07 DIAGNOSIS — M2141 Flat foot [pes planus] (acquired), right foot: Secondary | ICD-10-CM

## 2016-11-08 NOTE — Progress Notes (Addendum)
Subjective:  Kim Nielsen is a 66 y.o. Obese female patient who returns to office for follow up evaluation of right ankle pain and pes planus. Reports no pain and that her ankle feels better. Reports that she has tried some of the shoes I have recommended but still hasn't gotten complete relief; states the shoes are hard. Desires to discuss orthotics.   Patient Active Problem List   Diagnosis Date Noted  . BMI 40.0-44.9, adult ([place]) 01/08/2016  . Plantar fasciitis 10/30/2015  . Primary osteoarthritis of both feet 10/30/2015  . Tightness of left heel cord 10/30/2015  . Tightness of right heel cord 10/30/2015  . BMI 45.0-49.9, adult ([place]) 08/21/2015  . Cancer of central portion of right female breast ([place]) 08/21/2015  . Estrogen receptor positive tumor status 08/21/2015  . Morbid obesity ([place]) 08/21/2015    Current Outpatient Prescriptions on File Prior to Visit  Medication Sig Dispense Refill  . anastrozole (ARIMIDEX) 1 MG tablet     . anastrozole (ARIMIDEX) 1 MG tablet Take 1 mg by mouth.    Marland Kitchen aspirin EC 81 MG tablet Take by mouth.    . cetirizine (ZYRTEC) 10 MG tablet Take 10 mg by mouth.    . Cholecalciferol (VITAMIN D3) 2000 units capsule Take by mouth.    . divalproex (DEPAKOTE) 500 MG DR tablet     . fluticasone (FLONASE) 50 MCG/ACT nasal spray USE 2 SPRAYS IN EACH NOSTRIL EVERY DAY    . fluticasone (FLONASE) 50 MCG/ACT nasal spray     . furosemide (LASIX) 40 MG tablet TAKE 1 TABLET BY MOUTH EVERY DAY    . gabapentin (NEURONTIN) 300 MG capsule     . gabapentin (NEURONTIN) 300 MG capsule TAKE ONE CAPSULE BY MOUTH 3 TIMES A DAY    . latanoprost (XALATAN) 0.005 % ophthalmic solution     . latanoprost (XALATAN) 0.005 % ophthalmic solution INSTILL 1 DROP IN INTO BOTH EYES AT BEDTIME    . levothyroxine (SYNTHROID) 50 MCG tablet TAKE 1 TABLET DAILY    . losartan-hydrochlorothiazide (HYZAAR) 100-12.5 MG tablet TAKE 1 TABLET EVERY MORNING    . meloxicam (MOBIC) 7.5 MG tablet     .  methylPREDNISolone (MEDROL DOSEPAK) 4 MG TBPK tablet Take as instructed 21 tablet 0  . Multiple Vitamin (MULTIVITAMIN) capsule Take by mouth.    . pantoprazole (PROTONIX) 40 MG tablet TAKE 1 TABLET BY MOUTH EVERY DAY    . pantoprazole (PROTONIX) 40 MG tablet     . potassium chloride SA (KLOR-CON M20) 20 MEQ tablet TAKE 1 TABLET DAILY    . SYNTHROID 50 MCG tablet     . VITAMIN A PALMITATE PO Take by mouth.    . vitamin E 400 UNIT capsule Take by mouth.     Current Facility-Administered Medications on File Prior to Visit  Medication Dose Route Frequency Provider Last Rate Last Dose  . triamcinolone acetonide (KENALOG-40) injection 20 mg  20 mg Other Once Landis Martins, DPM        Allergies  Allergen Reactions  . Oxycodone     Objective:  General: Alert and oriented x3 in no acute distress  Dermatology: No open lesions bilateral lower extremities, no webspace macerations, no ecchymosis bilateral, all nails x 10 are well manicured.  Vascular: Dorsalis Pedis and Posterior Tibial pedal pulses palpable, Capillary Fill Time 3 seconds,(+) pedal hair growth bilateral, + edema and excessive fat bilateral lower extremities, Temperature gradient within normal limits.  Neurology: Gross sensation intact via light touch bilateral. (- )  Tinels sign right.   Musculoskeletal: No tenderness with palpation at right ankle at medial and lateral ankle gutters. Negative talar tilt, Negative tib-fib stress, No instability. Severe pes planus with lateral ankle impingement right>left.  No pain with calf compression bilateral. Range of motion limited with no guarding on right ankle. Strength within normal limits in all groups bilateral.  Assessment and Plan: Problem List Items Addressed This Visit    None    Visit Diagnoses    Pes planus of both feet    -  Primary   Primary osteoarthritis of right ankle       Foot pain, bilateral          -Complete examination performed -Continue with good supportive  shoes -Advised Tylenol arthritis, rest, ice, elevation if needed for flare up -Patient to return to office next visit to see Betha for casting for UCBL orthotic to be sent to [facility] or sooner if condition worsens.  Landis Martins, DPM

## 2016-11-21 DIAGNOSIS — Z6841 Body Mass Index (BMI) 40.0 and over, adult: Secondary | ICD-10-CM | POA: Diagnosis not present

## 2016-11-21 DIAGNOSIS — G5601 Carpal tunnel syndrome, right upper limb: Secondary | ICD-10-CM | POA: Diagnosis not present

## 2016-12-03 ENCOUNTER — Ambulatory Visit (INDEPENDENT_AMBULATORY_CARE_PROVIDER_SITE_OTHER): Payer: Medicare Other | Admitting: Sports Medicine

## 2016-12-03 DIAGNOSIS — M2141 Flat foot [pes planus] (acquired), right foot: Secondary | ICD-10-CM

## 2016-12-03 DIAGNOSIS — M19071 Primary osteoarthritis, right ankle and foot: Secondary | ICD-10-CM

## 2016-12-03 DIAGNOSIS — M2142 Flat foot [pes planus] (acquired), left foot: Secondary | ICD-10-CM | POA: Diagnosis not present

## 2016-12-03 DIAGNOSIS — M79671 Pain in right foot: Secondary | ICD-10-CM

## 2016-12-03 DIAGNOSIS — M79672 Pain in left foot: Secondary | ICD-10-CM

## 2016-12-03 NOTE — Progress Notes (Signed)
Patient was seen and evaluated by [facility]. Patient was casted today for custom functional foot orthotics. Prescription was sent to [facility] for UCBL type orthotic. Patient to return for pickup when called or when scheduled. -Dr. Cannon Kettle

## 2016-12-25 DIAGNOSIS — R928 Other abnormal and inconclusive findings on diagnostic imaging of breast: Secondary | ICD-10-CM | POA: Diagnosis not present

## 2016-12-25 DIAGNOSIS — Z9889 Other specified postprocedural states: Secondary | ICD-10-CM | POA: Diagnosis not present

## 2016-12-25 DIAGNOSIS — C50111 Malignant neoplasm of central portion of right female breast: Secondary | ICD-10-CM | POA: Diagnosis not present

## 2017-01-06 DIAGNOSIS — Z170 Estrogen receptor positive status [ER+]: Secondary | ICD-10-CM | POA: Diagnosis not present

## 2017-01-06 DIAGNOSIS — C50111 Malignant neoplasm of central portion of right female breast: Secondary | ICD-10-CM | POA: Diagnosis not present

## 2017-01-06 DIAGNOSIS — Z6841 Body Mass Index (BMI) 40.0 and over, adult: Secondary | ICD-10-CM | POA: Diagnosis not present

## 2017-01-29 ENCOUNTER — Ambulatory Visit: Payer: Medicare Other | Admitting: *Deleted

## 2017-01-29 DIAGNOSIS — M2142 Flat foot [pes planus] (acquired), left foot: Principal | ICD-10-CM

## 2017-01-29 DIAGNOSIS — M2141 Flat foot [pes planus] (acquired), right foot: Secondary | ICD-10-CM

## 2017-01-29 NOTE — Progress Notes (Signed)
Patient ID: Kim Nielsen, female   DOB: 09/08/1950, 66 y.o.   MRN: [id number]   Patient presents for orthotic pick up.  Verbal and written break in and wear instructions given.  Patient will follow up in 4 weeks if symptoms worsen or fail to improve.

## 2017-01-29 NOTE — Patient Instructions (Signed)

## 2017-03-02 DIAGNOSIS — Z923 Personal history of irradiation: Secondary | ICD-10-CM | POA: Diagnosis not present

## 2017-03-02 DIAGNOSIS — Z170 Estrogen receptor positive status [ER+]: Secondary | ICD-10-CM | POA: Diagnosis not present

## 2017-03-02 DIAGNOSIS — Z79811 Long term (current) use of aromatase inhibitors: Secondary | ICD-10-CM | POA: Diagnosis not present

## 2017-03-02 DIAGNOSIS — C50919 Malignant neoplasm of unspecified site of unspecified female breast: Secondary | ICD-10-CM | POA: Diagnosis not present

## 2017-03-02 DIAGNOSIS — Z853 Personal history of malignant neoplasm of breast: Secondary | ICD-10-CM | POA: Diagnosis not present

## 2017-03-05 ENCOUNTER — Ambulatory Visit (INDEPENDENT_AMBULATORY_CARE_PROVIDER_SITE_OTHER): Payer: Medicare Other | Admitting: Sports Medicine

## 2017-03-05 ENCOUNTER — Encounter (INDEPENDENT_AMBULATORY_CARE_PROVIDER_SITE_OTHER): Payer: Self-pay

## 2017-03-05 DIAGNOSIS — M79672 Pain in left foot: Secondary | ICD-10-CM | POA: Diagnosis not present

## 2017-03-05 DIAGNOSIS — M79671 Pain in right foot: Secondary | ICD-10-CM

## 2017-03-05 DIAGNOSIS — M2141 Flat foot [pes planus] (acquired), right foot: Secondary | ICD-10-CM

## 2017-03-05 DIAGNOSIS — M2142 Flat foot [pes planus] (acquired), left foot: Secondary | ICD-10-CM

## 2017-03-05 DIAGNOSIS — M19071 Primary osteoarthritis, right ankle and foot: Secondary | ICD-10-CM

## 2017-03-05 NOTE — Progress Notes (Signed)
Subjective:  Kim Nielsen is a 66 y.o. Obese female patient who returns to office for follow up evaluation of right ankle pain and pes planus. Reports that she has been wearing her orthotics and that they help. States that her feet take about 1 hour to adjust to them. Denies bursing, open wound, irritation, swelling, warmth, pain or any other acute symptoms.   Patient Active Problem List   Diagnosis Date Noted  . BMI 40.0-44.9, adult ([place]) 01/08/2016  . Plantar fasciitis 10/30/2015  . Primary osteoarthritis of both feet 10/30/2015  . Tightness of left heel cord 10/30/2015  . Tightness of right heel cord 10/30/2015  . BMI 45.0-49.9, adult ([place]) 08/21/2015  . Cancer of central portion of right female breast ([place]) 08/21/2015  . Estrogen receptor positive tumor status 08/21/2015  . Morbid obesity ([place]) 08/21/2015    Current Outpatient Prescriptions on File Prior to Visit  Medication Sig Dispense Refill  . anastrozole (ARIMIDEX) 1 MG tablet     . anastrozole (ARIMIDEX) 1 MG tablet Take 1 mg by mouth.    Marland Kitchen aspirin EC 81 MG tablet Take by mouth.    . cetirizine (ZYRTEC) 10 MG tablet Take 10 mg by mouth.    . Cholecalciferol (VITAMIN D3) 2000 units capsule Take by mouth.    . divalproex (DEPAKOTE) 500 MG DR tablet     . fluticasone (FLONASE) 50 MCG/ACT nasal spray USE 2 SPRAYS IN EACH NOSTRIL EVERY DAY    . fluticasone (FLONASE) 50 MCG/ACT nasal spray     . furosemide (LASIX) 40 MG tablet TAKE 1 TABLET BY MOUTH EVERY DAY    . gabapentin (NEURONTIN) 300 MG capsule     . gabapentin (NEURONTIN) 300 MG capsule TAKE ONE CAPSULE BY MOUTH 3 TIMES A DAY    . latanoprost (XALATAN) 0.005 % ophthalmic solution     . latanoprost (XALATAN) 0.005 % ophthalmic solution INSTILL 1 DROP IN INTO BOTH EYES AT BEDTIME    . levothyroxine (SYNTHROID) 50 MCG tablet TAKE 1 TABLET DAILY    . losartan-hydrochlorothiazide (HYZAAR) 100-12.5 MG tablet TAKE 1 TABLET EVERY MORNING    . meloxicam (MOBIC) 7.5 MG tablet      . methylPREDNISolone (MEDROL DOSEPAK) 4 MG TBPK tablet Take as instructed 21 tablet 0  . Multiple Vitamin (MULTIVITAMIN) capsule Take by mouth.    . pantoprazole (PROTONIX) 40 MG tablet TAKE 1 TABLET BY MOUTH EVERY DAY    . pantoprazole (PROTONIX) 40 MG tablet     . potassium chloride SA (KLOR-CON M20) 20 MEQ tablet TAKE 1 TABLET DAILY    . SYNTHROID 50 MCG tablet     . VITAMIN A PALMITATE PO Take by mouth.    . vitamin E 400 UNIT capsule Take by mouth.     Current Facility-Administered Medications on File Prior to Visit  Medication Dose Route Frequency Provider Last Rate Last Dose  . triamcinolone acetonide (KENALOG-40) injection 20 mg  20 mg Other Once Landis Martins, DPM        Allergies  Allergen Reactions  . Oxycodone     Objective:  General: Alert and oriented x3 in no acute distress  Dermatology: No open lesions bilateral lower extremities, no webspace macerations, no ecchymosis bilateral, all nails x 10 are well manicured.  Vascular: Dorsalis Pedis and Posterior Tibial pedal pulses palpable, Capillary Fill Time 3 seconds,(+) pedal hair growth bilateral, + edema and excessive fat bilateral lower extremities, Temperature gradient within normal limits.  Neurology: Gross sensation intact via light touch  bilateral. (- )Tinels sign right.   Musculoskeletal: No tenderness with palpation at right ankle at medial and lateral ankle gutters. Negative talar tilt, Negative tib-fib stress, No instability. Severe pes planus with lateral ankle impingement right>left.  No pain with calf compression bilateral. Range of motion limited with no guarding on right ankle. Strength within normal limits in all groups bilateral.  Orthotic exam- contour arch with adequate biomechanical control and support in gait.  Assessment and Plan: Problem List Items Addressed This Visit    None    Visit Diagnoses    Pes planus of both feet    -  Primary   Primary osteoarthritis of right ankle       Foot  pain, bilateral          -Complete examination performed -Continue with good supportive shoes and custom insoles  -Advised PRN Tylenol arthritis, rest, ice, elevation if needed for flare ups -Patient to return to office as needed or sooner if condition worsens.  Landis Martins, DPM

## 2017-03-09 DIAGNOSIS — G40309 Generalized idiopathic epilepsy and epileptic syndromes, not intractable, without status epilepticus: Secondary | ICD-10-CM | POA: Diagnosis not present

## 2017-03-09 DIAGNOSIS — Z0000 Encounter for general adult medical examination without abnormal findings: Secondary | ICD-10-CM | POA: Diagnosis not present

## 2017-03-09 DIAGNOSIS — Z79899 Other long term (current) drug therapy: Secondary | ICD-10-CM | POA: Diagnosis not present

## 2017-03-09 DIAGNOSIS — K219 Gastro-esophageal reflux disease without esophagitis: Secondary | ICD-10-CM | POA: Diagnosis not present

## 2017-03-09 DIAGNOSIS — I10 Essential (primary) hypertension: Secondary | ICD-10-CM | POA: Diagnosis not present

## 2017-03-09 DIAGNOSIS — E669 Obesity, unspecified: Secondary | ICD-10-CM | POA: Diagnosis not present

## 2017-03-09 DIAGNOSIS — E039 Hypothyroidism, unspecified: Secondary | ICD-10-CM | POA: Diagnosis not present

## 2017-03-09 DIAGNOSIS — Z23 Encounter for immunization: Secondary | ICD-10-CM | POA: Diagnosis not present

## 2017-08-19 DIAGNOSIS — R509 Fever, unspecified: Secondary | ICD-10-CM | POA: Diagnosis not present

## 2017-08-19 DIAGNOSIS — Z6841 Body Mass Index (BMI) 40.0 and over, adult: Secondary | ICD-10-CM | POA: Diagnosis not present

## 2017-08-19 DIAGNOSIS — J101 Influenza due to other identified influenza virus with other respiratory manifestations: Secondary | ICD-10-CM | POA: Diagnosis not present

## 2017-08-31 DIAGNOSIS — C50919 Malignant neoplasm of unspecified site of unspecified female breast: Secondary | ICD-10-CM | POA: Diagnosis not present

## 2017-08-31 DIAGNOSIS — Z170 Estrogen receptor positive status [ER+]: Secondary | ICD-10-CM | POA: Diagnosis not present

## 2017-08-31 DIAGNOSIS — Z79811 Long term (current) use of aromatase inhibitors: Secondary | ICD-10-CM | POA: Diagnosis not present

## 2017-08-31 DIAGNOSIS — Z853 Personal history of malignant neoplasm of breast: Secondary | ICD-10-CM | POA: Diagnosis not present

## 2017-11-11 ENCOUNTER — Encounter: Payer: Self-pay | Admitting: Sports Medicine

## 2017-11-11 ENCOUNTER — Other Ambulatory Visit: Payer: Self-pay | Admitting: Sports Medicine

## 2017-11-11 ENCOUNTER — Ambulatory Visit (INDEPENDENT_AMBULATORY_CARE_PROVIDER_SITE_OTHER): Payer: Medicare Other | Admitting: Sports Medicine

## 2017-11-11 ENCOUNTER — Ambulatory Visit (INDEPENDENT_AMBULATORY_CARE_PROVIDER_SITE_OTHER): Payer: Medicare Other

## 2017-11-11 DIAGNOSIS — M25571 Pain in right ankle and joints of right foot: Secondary | ICD-10-CM

## 2017-11-11 DIAGNOSIS — M7671 Peroneal tendinitis, right leg: Secondary | ICD-10-CM

## 2017-11-11 DIAGNOSIS — M19071 Primary osteoarthritis, right ankle and foot: Secondary | ICD-10-CM

## 2017-11-11 MED ORDER — TRIAMCINOLONE ACETONIDE 40 MG/ML IJ SUSP: 20.0000 mg | Freq: Once | INTRAMUSCULAR | Status: AC

## 2017-11-11 NOTE — Progress Notes (Signed)
Subjective:  Kim Nielsen is a 67 y.o. Obese female patient who returns to office for evaluation of new onset right ankle pain.  Patient states that the side of her right ankle has been hurting over the last couple months and slowly progressively getting worse ranks pain today 10 out of 10 sharp in nature denies any injury but does admit to significant swelling that has slowly worsened.  States that excessive walking and standing aggravates her right foot and ankle. Denies any other symptoms like nausea, vomiting, fever, chills or any other constitutional symptoms at this time.  Patient Active Problem List   Diagnosis Date Noted  . BMI 40.0-44.9, adult ([place]) 01/08/2016  . Plantar fasciitis 10/30/2015  . Primary osteoarthritis of both feet 10/30/2015  . Tightness of left heel cord 10/30/2015  . Tightness of right heel cord 10/30/2015  . BMI 45.0-49.9, adult ([place]) 08/21/2015  . Cancer of central portion of right female breast ([place]) 08/21/2015  . Estrogen receptor positive tumor status 08/21/2015  . Morbid obesity ([place]) 08/21/2015    Current Outpatient Medications on File Prior to Visit  Medication Sig Dispense Refill  . anastrozole (ARIMIDEX) 1 MG tablet     . anastrozole (ARIMIDEX) 1 MG tablet Take 1 mg by mouth.    Marland Kitchen aspirin EC 81 MG tablet Take by mouth.    . cetirizine (ZYRTEC) 10 MG tablet Take 10 mg by mouth.    . Cholecalciferol (VITAMIN D3) 2000 units capsule Take by mouth.    . divalproex (DEPAKOTE) 500 MG DR tablet     . fluticasone (FLONASE) 50 MCG/ACT nasal spray USE 2 SPRAYS IN EACH NOSTRIL EVERY DAY    . fluticasone (FLONASE) 50 MCG/ACT nasal spray     . furosemide (LASIX) 40 MG tablet TAKE 1 TABLET BY MOUTH EVERY DAY    . gabapentin (NEURONTIN) 300 MG capsule     . gabapentin (NEURONTIN) 300 MG capsule TAKE ONE CAPSULE BY MOUTH 3 TIMES A DAY    . latanoprost (XALATAN) 0.005 % ophthalmic solution     . latanoprost (XALATAN) 0.005 % ophthalmic solution INSTILL 1 DROP IN  INTO BOTH EYES AT BEDTIME    . levothyroxine (SYNTHROID) 50 MCG tablet TAKE 1 TABLET DAILY    . losartan-hydrochlorothiazide (HYZAAR) 100-12.5 MG tablet TAKE 1 TABLET EVERY MORNING    . meloxicam (MOBIC) 7.5 MG tablet     . methylPREDNISolone (MEDROL DOSEPAK) 4 MG TBPK tablet Take as instructed 21 tablet 0  . Multiple Vitamin (MULTIVITAMIN) capsule Take by mouth.    . pantoprazole (PROTONIX) 40 MG tablet TAKE 1 TABLET BY MOUTH EVERY DAY    . pantoprazole (PROTONIX) 40 MG tablet     . potassium chloride SA (KLOR-CON M20) 20 MEQ tablet TAKE 1 TABLET DAILY    . SYNTHROID 50 MCG tablet     . VITAMIN A PALMITATE PO Take by mouth.    . vitamin E 400 UNIT capsule Take by mouth.     Current Facility-Administered Medications on File Prior to Visit  Medication Dose Route Frequency Provider Last Rate Last Dose  . triamcinolone acetonide (KENALOG-40) injection 20 mg  20 mg Other Once Landis Martins, DPM        Allergies  Allergen Reactions  . Oxycodone     Objective:  General: Alert and oriented x3 in no acute distress  Dermatology: No open lesions bilateral lower extremities, no webspace macerations, no ecchymosis bilateral, all nails x 10 are well manicured.  Vascular: Dorsalis Pedis and  Posterior Tibial pedal pulses palpable, Capillary Fill Time 3 seconds,(+) pedal hair growth bilateral, + edema focal to the right lateral ankle and excessive fat bilateral lower extremities, Temperature gradient within normal limits.  Neurology: Gross sensation intact via light touch bilateral. (- )Tinels sign right.   Musculoskeletal: There is mild tenderness with palpation at right lateral ankle gutter and along the peroneal tendon course. Negative talar tilt, Negative tib-fib stress, No instability. Severe pes planus with lateral ankle impingement right>left.  No pain with calf compression bilateral. Range of motion limited with no guarding on right ankle. Strength within normal limits in all groups  bilateral.  X-rays of the right ankle: Joint space narrowing supportive of arthritis, there is mild soft tissue swelling at the lateral ankle no fracture or dislocation unchanged pes planus deformity with arthritic changes diffusely across the foot no other acute findings.  Assessment and Plan: Problem List Items Addressed This Visit    None    Visit Diagnoses    Peroneal tendonitis, right    -  Primary   Relevant Medications   triamcinolone acetonide (KENALOG-40) injection 20 mg   Primary osteoarthritis of right ankle       Relevant Medications   triamcinolone acetonide (KENALOG-40) injection 20 mg   Other Relevant Orders   DG Ankle Complete Right   Acute right ankle pain          -Complete examination performed -X-rays reviewed -Discussed with patient treatment options of likely tendinitis with acute on chronic flare of arthritis in the setting of pes planus which can be causing impingement at the side of the right ankle -Patient declined Unna boot -After oral consent and aseptic prep, injected a mixture containing 1 ml of 2%  plain lidocaine, 1 ml 0.5% plain marcaine, 0.5 ml of kenalog 40 and 0.5 ml of dexamethasone phosphate along the peroneal tendon course of the right ankle without complication. Post-injection care discussed with patient.  -Recommend patient to ice today to assist with any pain secondary to injection -Advised patient to pick up Tylenol arthritis to take for pain and inflammation at right ankle -Dispensed compression sleeve to use as instructedSurgitube  -Advised patient to limit activities to tolerance and to rest as much as possible especially over the holiday weekend -Patient to return to office in 2 to 3 weeks or sooner if condition worsens.  Landis Martins, DPM

## 2017-12-02 ENCOUNTER — Ambulatory Visit: Payer: Medicare Other | Admitting: Sports Medicine

## 2017-12-09 DIAGNOSIS — R1031 Right lower quadrant pain: Secondary | ICD-10-CM | POA: Diagnosis not present

## 2017-12-09 DIAGNOSIS — N3001 Acute cystitis with hematuria: Secondary | ICD-10-CM | POA: Diagnosis not present

## 2017-12-28 DIAGNOSIS — G8929 Other chronic pain: Secondary | ICD-10-CM | POA: Diagnosis not present

## 2017-12-28 DIAGNOSIS — R358 Other polyuria: Secondary | ICD-10-CM | POA: Diagnosis not present

## 2017-12-28 DIAGNOSIS — M25562 Pain in left knee: Secondary | ICD-10-CM | POA: Diagnosis not present

## 2017-12-28 DIAGNOSIS — M25561 Pain in right knee: Secondary | ICD-10-CM | POA: Diagnosis not present

## 2017-12-31 DIAGNOSIS — C50111 Malignant neoplasm of central portion of right female breast: Secondary | ICD-10-CM | POA: Diagnosis not present

## 2017-12-31 DIAGNOSIS — R928 Other abnormal and inconclusive findings on diagnostic imaging of breast: Secondary | ICD-10-CM | POA: Diagnosis not present

## 2018-01-01 DIAGNOSIS — Z13820 Encounter for screening for osteoporosis: Secondary | ICD-10-CM | POA: Diagnosis not present

## 2018-01-01 DIAGNOSIS — M8589 Other specified disorders of bone density and structure, multiple sites: Secondary | ICD-10-CM | POA: Diagnosis not present

## 2018-01-04 DIAGNOSIS — M1712 Unilateral primary osteoarthritis, left knee: Secondary | ICD-10-CM | POA: Diagnosis not present

## 2018-01-08 DIAGNOSIS — Z6841 Body Mass Index (BMI) 40.0 and over, adult: Secondary | ICD-10-CM | POA: Diagnosis not present

## 2018-01-18 DIAGNOSIS — Z170 Estrogen receptor positive status [ER+]: Secondary | ICD-10-CM | POA: Diagnosis not present

## 2018-01-18 DIAGNOSIS — C50111 Malignant neoplasm of central portion of right female breast: Secondary | ICD-10-CM | POA: Diagnosis not present

## 2018-01-18 DIAGNOSIS — Z6841 Body Mass Index (BMI) 40.0 and over, adult: Secondary | ICD-10-CM | POA: Diagnosis not present

## 2018-02-26 DIAGNOSIS — Z6841 Body Mass Index (BMI) 40.0 and over, adult: Secondary | ICD-10-CM | POA: Diagnosis not present

## 2018-03-03 DIAGNOSIS — C50919 Malignant neoplasm of unspecified site of unspecified female breast: Secondary | ICD-10-CM | POA: Diagnosis not present

## 2018-03-03 DIAGNOSIS — Z853 Personal history of malignant neoplasm of breast: Secondary | ICD-10-CM | POA: Diagnosis not present

## 2018-03-03 DIAGNOSIS — Z170 Estrogen receptor positive status [ER+]: Secondary | ICD-10-CM | POA: Diagnosis not present

## 2018-03-03 DIAGNOSIS — Z79810 Long term (current) use of selective estrogen receptor modulators (SERMs): Secondary | ICD-10-CM | POA: Diagnosis not present

## 2018-03-03 DIAGNOSIS — Z79811 Long term (current) use of aromatase inhibitors: Secondary | ICD-10-CM | POA: Diagnosis not present

## 2018-03-11 DIAGNOSIS — R358 Other polyuria: Secondary | ICD-10-CM | POA: Diagnosis not present

## 2018-03-11 DIAGNOSIS — Z1331 Encounter for screening for depression: Secondary | ICD-10-CM | POA: Diagnosis not present

## 2018-03-11 DIAGNOSIS — Z9181 History of falling: Secondary | ICD-10-CM | POA: Diagnosis not present

## 2018-03-11 DIAGNOSIS — Z0000 Encounter for general adult medical examination without abnormal findings: Secondary | ICD-10-CM | POA: Diagnosis not present

## 2018-03-11 DIAGNOSIS — Z23 Encounter for immunization: Secondary | ICD-10-CM | POA: Diagnosis not present

## 2018-03-11 DIAGNOSIS — E039 Hypothyroidism, unspecified: Secondary | ICD-10-CM | POA: Diagnosis not present

## 2018-04-23 DIAGNOSIS — Z6841 Body Mass Index (BMI) 40.0 and over, adult: Secondary | ICD-10-CM | POA: Diagnosis not present

## 2018-05-04 ENCOUNTER — Ambulatory Visit (INDEPENDENT_AMBULATORY_CARE_PROVIDER_SITE_OTHER): Payer: Medicare Other | Admitting: Podiatry

## 2018-05-04 ENCOUNTER — Ambulatory Visit (INDEPENDENT_AMBULATORY_CARE_PROVIDER_SITE_OTHER): Payer: Medicare Other

## 2018-05-04 ENCOUNTER — Other Ambulatory Visit: Payer: Self-pay | Admitting: Podiatry

## 2018-05-04 DIAGNOSIS — M7661 Achilles tendinitis, right leg: Secondary | ICD-10-CM

## 2018-05-04 DIAGNOSIS — I872 Venous insufficiency (chronic) (peripheral): Secondary | ICD-10-CM

## 2018-05-04 DIAGNOSIS — R600 Localized edema: Secondary | ICD-10-CM

## 2018-05-04 DIAGNOSIS — M25571 Pain in right ankle and joints of right foot: Secondary | ICD-10-CM | POA: Diagnosis not present

## 2018-05-04 NOTE — Progress Notes (Signed)
Subjective:  Patient ID: Kim Nielsen, female    DOB: 01/26/1951,  MRN: [id number]  Chief Complaint  Patient presents with  . Pain    R back heel pain and swelling around the ankle x 1 wk; 10/10 sharp constant pain when walking -no injury Tx: compression sock and tylenol    67 y.o. female presents with the above complaint. History as above.  Review of Systems: Negative except as noted in the HPI. Denies N/V/F/Ch.  No past medical history on file.  Current Outpatient Medications:  .  anastrozole (ARIMIDEX) 1 MG tablet, , Disp: , Rfl:  .  aspirin EC 81 MG tablet, Take by mouth., Disp: , Rfl:  .  diclofenac sodium (VOLTAREN) 1 % GEL, APPLY 4 GRAMS TO AREA UP TO 4 TIMES DAILY, Disp: , Rfl:  .  divalproex (DEPAKOTE) 500 MG DR tablet, , Disp: , Rfl:  .  fluticasone (FLONASE) 50 MCG/ACT nasal spray, USE 2 SPRAYS IN EACH NOSTRIL EVERY DAY, Disp: , Rfl:  .  furosemide (LASIX) 40 MG tablet, TAKE 1 TABLET BY MOUTH EVERY DAY, Disp: , Rfl:  .  gabapentin (NEURONTIN) 300 MG capsule, , Disp: , Rfl:  .  hydrochlorothiazide (HYDRODIURIL) 12.5 MG tablet, Take 12.5 mg by mouth daily., Disp: , Rfl: 3 .  latanoprost (XALATAN) 0.005 % ophthalmic solution, , Disp: , Rfl:  .  losartan-hydrochlorothiazide (HYZAAR) 100-12.5 MG tablet, TAKE 1 TABLET EVERY MORNING, Disp: , Rfl:  .  meloxicam (MOBIC) 7.5 MG tablet, , Disp: , Rfl:  .  oxybutynin (DITROPAN-XL) 10 MG 24 hr tablet, Take 10 mg by mouth at bedtime., Disp: , Rfl:  .  pantoprazole (PROTONIX) 40 MG tablet, , Disp: , Rfl:  .  potassium chloride SA (KLOR-CON M20) 20 MEQ tablet, TAKE 1 TABLET DAILY, Disp: , Rfl:  .  SYNTHROID 50 MCG tablet, , Disp: , Rfl:   Current Facility-Administered Medications:  .  triamcinolone acetonide (KENALOG-40) injection 20 mg, 20 mg, Other, Once, Stover, Titorya, DPM .  triamcinolone acetonide (KENALOG-40) injection 20 mg, 20 mg, Other, Once, Landis Martins, DPM  Social History   Tobacco Use  Smoking Status Unknown If  Ever Smoked  Smokeless Tobacco Never Used    Allergies  Allergen Reactions  . Oxycodone    Objective:  There were no vitals filed for this visit. There is no height or weight on file to calculate BMI. Constitutional Well developed. Well nourished.  Vascular Dorsalis pedis pulses palpable bilaterally. Posterior tibial pulses palpable bilaterally. Capillary refill normal to all digits.  No cyanosis or clubbing noted. Pedal hair growth normal.  Neurologic Normal speech. Oriented to person, place, and time. Epicritic sensation to light touch grossly present bilaterally.  Dermatologic Nails well groomed and normal in appearance. No open wounds. No skin lesions.  Orthopedic: Normal joint ROM without pain or crepitus bilaterally. No visible deformities. ATFL tenderness right No pain at the posterior or plantar calcaneus No pain at the achilles tendon insertion. Achilles tendon intact without dell. Good Achilles strength noted.   Radiographs: Taken and reviewed. Severe hindfoot DJD. No acute fractures Assessment:   1. Tendonitis, Achilles, right   2. Right ankle pain, unspecified chronicity   3. Venous (peripheral) insufficiency   4. Localized edema    Plan:  Patient was evaluated and treated and all questions answered.  Venous Insufficiency, R Ankle swelling, possible tendonitis -Unna boot and compression dressing applied. -F/u in 2 weeks for recheck.  Return in about 2 weeks (around 05/18/2018) for Ankle swelling  f/u .

## 2018-05-18 ENCOUNTER — Ambulatory Visit (INDEPENDENT_AMBULATORY_CARE_PROVIDER_SITE_OTHER): Payer: Medicare Other | Admitting: Podiatry

## 2018-05-18 DIAGNOSIS — M24173 Other articular cartilage disorders, unspecified ankle: Secondary | ICD-10-CM

## 2018-05-18 DIAGNOSIS — M2141 Flat foot [pes planus] (acquired), right foot: Secondary | ICD-10-CM

## 2018-05-18 DIAGNOSIS — M2142 Flat foot [pes planus] (acquired), left foot: Secondary | ICD-10-CM

## 2018-05-18 DIAGNOSIS — M25571 Pain in right ankle and joints of right foot: Secondary | ICD-10-CM | POA: Diagnosis not present

## 2018-05-18 DIAGNOSIS — M249 Joint derangement, unspecified: Secondary | ICD-10-CM

## 2018-05-18 NOTE — Progress Notes (Signed)
Subjective:  Patient ID: Kim Nielsen, female    DOB: 1951-01-18,  MRN: [id number]  Chief Complaint  Patient presents with  . Tendonitis    F/U R ankle swelling and tendonitis Pt.s tates," the cast he applied helped a lot, but it's still real sore and swollen; 5/10." Tx: none   67 y.o. female presents with the above complaint. History as above.  Review of Systems: Negative except as noted in the HPI. Denies N/V/F/Ch.  No past medical history on file.  Current Outpatient Medications:  .  anastrozole (ARIMIDEX) 1 MG tablet, , Disp: , Rfl:  .  aspirin EC 81 MG tablet, Take by mouth., Disp: , Rfl:  .  diclofenac sodium (VOLTAREN) 1 % GEL, APPLY 4 GRAMS TO AREA UP TO 4 TIMES DAILY, Disp: , Rfl:  .  divalproex (DEPAKOTE) 500 MG DR tablet, , Disp: , Rfl:  .  fluticasone (FLONASE) 50 MCG/ACT nasal spray, USE 2 SPRAYS IN EACH NOSTRIL EVERY DAY, Disp: , Rfl:  .  furosemide (LASIX) 40 MG tablet, TAKE 1 TABLET BY MOUTH EVERY DAY, Disp: , Rfl:  .  gabapentin (NEURONTIN) 300 MG capsule, , Disp: , Rfl:  .  hydrochlorothiazide (HYDRODIURIL) 12.5 MG tablet, Take 12.5 mg by mouth daily., Disp: , Rfl: 3 .  latanoprost (XALATAN) 0.005 % ophthalmic solution, , Disp: , Rfl:  .  losartan-hydrochlorothiazide (HYZAAR) 100-12.5 MG tablet, TAKE 1 TABLET EVERY MORNING, Disp: , Rfl:  .  meloxicam (MOBIC) 7.5 MG tablet, , Disp: , Rfl:  .  oxybutynin (DITROPAN-XL) 10 MG 24 hr tablet, Take 10 mg by mouth at bedtime., Disp: , Rfl:  .  pantoprazole (PROTONIX) 40 MG tablet, , Disp: , Rfl:  .  potassium chloride SA (KLOR-CON M20) 20 MEQ tablet, TAKE 1 TABLET DAILY, Disp: , Rfl:  .  SYNTHROID 50 MCG tablet, , Disp: , Rfl:   Current Facility-Administered Medications:  .  triamcinolone acetonide (KENALOG-40) injection 20 mg, 20 mg, Other, Once, Stover, Titorya, DPM .  triamcinolone acetonide (KENALOG-40) injection 20 mg, 20 mg, Other, Once, Landis Martins, DPM  Social History   Tobacco Use  Smoking Status Unknown  If Ever Smoked  Smokeless Tobacco Never Used    Allergies  Allergen Reactions  . Oxycodone    Objective:  There were no vitals filed for this visit. There is no height or weight on file to calculate BMI. Constitutional Well developed. Well nourished.  Vascular Dorsalis pedis pulses palpable bilaterally. Posterior tibial pulses palpable bilaterally. Capillary refill normal to all digits.  No cyanosis or clubbing noted. Pedal hair growth normal.  Neurologic Normal speech. Oriented to person, place, and time. Epicritic sensation to light touch grossly present bilaterally.  Dermatologic Nails well groomed and normal in appearance. No open wounds. No skin lesions.  Orthopedic: Normal joint ROM without pain or crepitus bilaterally. No visible deformities. ATFL tenderness right No pain at the posterior or plantar calcaneus No pain at the achilles tendon insertion. Achilles tendon intact without dell. Good Achilles strength noted. Pain palpation about the sinus Tarsi right Severe pes planus noted   Radiographs:  None today. Assessment:   1. Sinus tarsitis of right foot   2. Pes planus of both feet   3. Arthralgia of hindfoot, right   4. Joint derangement of ankle or foot    Plan:  Patient was evaluated and treated and all questions answered.  Sinus tarsitis right ankle -Injection delivered as below.  Procedure: Joint Injection Location: Right STJ joint Skin Prep: Alcohol.  Injectate: 0.5 cc 1% lidocaine plain, 1 cc dexamethasone phosphate, 0.5 cc Kenalog 10 Disposition: Patient tolerated procedure well. Injection site dressed with a band-aid. Signed visit Visit Pes planus with hindfoot arthralgia -Would benefit from AFO brace.  Will refer to orthotist for fabrication -Discussed that she will need to wear this brace daily.  Return in about 3 weeks (around 06/08/2018) for sinus tarsitis Right.

## 2018-05-24 DIAGNOSIS — R05 Cough: Secondary | ICD-10-CM | POA: Diagnosis not present

## 2018-05-24 DIAGNOSIS — Z6841 Body Mass Index (BMI) 40.0 and over, adult: Secondary | ICD-10-CM | POA: Diagnosis not present

## 2018-05-27 ENCOUNTER — Other Ambulatory Visit: Payer: Medicare Other | Admitting: *Deleted

## 2018-06-08 ENCOUNTER — Ambulatory Visit (INDEPENDENT_AMBULATORY_CARE_PROVIDER_SITE_OTHER): Payer: Medicare Other | Admitting: Podiatry

## 2018-06-08 DIAGNOSIS — M25571 Pain in right ankle and joints of right foot: Secondary | ICD-10-CM

## 2018-06-08 DIAGNOSIS — M2141 Flat foot [pes planus] (acquired), right foot: Secondary | ICD-10-CM | POA: Diagnosis not present

## 2018-06-08 DIAGNOSIS — M2142 Flat foot [pes planus] (acquired), left foot: Secondary | ICD-10-CM

## 2018-06-14 DIAGNOSIS — M170 Bilateral primary osteoarthritis of knee: Secondary | ICD-10-CM | POA: Diagnosis not present

## 2018-06-20 NOTE — Progress Notes (Signed)
Subjective:  Patient ID: Kim Nielsen, female    DOB: [birth year],  MRN: [id number]  Chief Complaint  Patient presents with  . sinus tarsitis    F/U R foot Pt. states," the pain is gone, but it still stays sore on the oouter side of my anke; 5/10 constant soreness." Tx: none   67 y.o. female presents with the above complaint. History as above.  Review of Systems: Negative except as noted in the HPI. Denies N/V/F/Ch.  No past medical history on file.  Current Outpatient Medications:  .  anastrozole (ARIMIDEX) 1 MG tablet, , Disp: , Rfl:  .  aspirin EC 81 MG tablet, Take by mouth., Disp: , Rfl:  .  diclofenac sodium (VOLTAREN) 1 % GEL, APPLY 4 GRAMS TO AREA UP TO 4 TIMES DAILY, Disp: , Rfl:  .  divalproex (DEPAKOTE) 500 MG DR tablet, , Disp: , Rfl:  .  fluticasone (FLONASE) 50 MCG/ACT nasal spray, USE 2 SPRAYS IN EACH NOSTRIL EVERY DAY, Disp: , Rfl:  .  furosemide (LASIX) 40 MG tablet, TAKE 1 TABLET BY MOUTH EVERY DAY, Disp: , Rfl:  .  gabapentin (NEURONTIN) 300 MG capsule, , Disp: , Rfl:  .  hydrochlorothiazide (HYDRODIURIL) 12.5 MG tablet, Take 12.5 mg by mouth daily., Disp: , Rfl: 3 .  latanoprost (XALATAN) 0.005 % ophthalmic solution, , Disp: , Rfl:  .  losartan-hydrochlorothiazide (HYZAAR) 100-12.5 MG tablet, TAKE 1 TABLET EVERY MORNING, Disp: , Rfl:  .  meloxicam (MOBIC) 7.5 MG tablet, , Disp: , Rfl:  .  oxybutynin (DITROPAN-XL) 10 MG 24 hr tablet, Take 10 mg by mouth at bedtime., Disp: , Rfl:  .  pantoprazole (PROTONIX) 40 MG tablet, , Disp: , Rfl:  .  potassium chloride SA (KLOR-CON M20) 20 MEQ tablet, TAKE 1 TABLET DAILY, Disp: , Rfl:  .  SYNTHROID 50 MCG tablet, , Disp: , Rfl:   Current Facility-Administered Medications:  .  triamcinolone acetonide (KENALOG-40) injection 20 mg, 20 mg, Other, Once, Stover, Titorya, DPM .  triamcinolone acetonide (KENALOG-40) injection 20 mg, 20 mg, Other, Once, Landis Martins, DPM  Social History   Tobacco Use  Smoking Status Unknown If  Ever Smoked  Smokeless Tobacco Never Used    Allergies  Allergen Reactions  . Oxycodone    Objective:  There were no vitals filed for this visit. There is no height or weight on file to calculate BMI. Constitutional Well developed. Well nourished.  Vascular Dorsalis pedis pulses palpable bilaterally. Posterior tibial pulses palpable bilaterally. Capillary refill normal to all digits.  No cyanosis or clubbing noted. Pedal hair growth normal.  Neurologic Normal speech. Oriented to person, place, and time. Epicritic sensation to light touch grossly present bilaterally.  Dermatologic Nails well groomed and normal in appearance. No open wounds. No skin lesions.  Orthopedic: Normal joint ROM without pain or crepitus bilaterally. No visible deformities. ATFL tenderness right No pain at the posterior or plantar calcaneus No pain at the achilles tendon insertion. Achilles tendon intact without dell. Good Achilles strength noted. Pain palpation about the sinus Tarsi right Severe pes planus noted   Radiographs:  None today. Assessment:   1. Sinus tarsitis of right foot   2. Pes planus of both feet   3. Arthralgia of hindfoot, right    Plan:  Patient was evaluated and treated and all questions answered.  Pes planus with hindfoot arthralgia -Due to continued pain and swelling recommend patient follow-up after AFO brace fabrication to further evaluate  No follow-ups on file.

## 2018-07-13 ENCOUNTER — Ambulatory Visit (INDEPENDENT_AMBULATORY_CARE_PROVIDER_SITE_OTHER): Payer: Medicare Other | Admitting: *Deleted

## 2018-07-13 DIAGNOSIS — M7671 Peroneal tendinitis, right leg: Secondary | ICD-10-CM

## 2018-07-13 DIAGNOSIS — M25571 Pain in right ankle and joints of right foot: Secondary | ICD-10-CM | POA: Diagnosis not present

## 2018-07-13 DIAGNOSIS — M19071 Primary osteoarthritis, right ankle and foot: Secondary | ICD-10-CM

## 2018-07-13 DIAGNOSIS — M7661 Achilles tendinitis, right leg: Secondary | ICD-10-CM

## 2018-07-13 NOTE — Patient Instructions (Signed)

## 2018-07-22 NOTE — Progress Notes (Signed)
Patient ID: Kim Nielsen, female   DOB: [birth year], 68 y.o.   MRN: [id number]  Patient presents for fitting of Drain with [facility] Certified Pedorthist. Written and verbal break in instructions given. Patient will follow up in 6 weeks with Dr. March Rummage.

## 2018-08-24 ENCOUNTER — Other Ambulatory Visit: Payer: Self-pay

## 2018-08-24 ENCOUNTER — Ambulatory Visit (INDEPENDENT_AMBULATORY_CARE_PROVIDER_SITE_OTHER): Payer: Medicare Other | Admitting: Podiatry

## 2018-08-24 DIAGNOSIS — M25571 Pain in right ankle and joints of right foot: Secondary | ICD-10-CM | POA: Diagnosis not present

## 2018-08-24 DIAGNOSIS — M19071 Primary osteoarthritis, right ankle and foot: Secondary | ICD-10-CM | POA: Diagnosis not present

## 2018-08-24 NOTE — Progress Notes (Signed)
Subjective:  Patient ID: Kim Nielsen, female    DOB: 1950/08/27,  MRN: [id number]  Chief Complaint  Patient presents with  . brace follow up    F/u pes planus w/ hindoot arthralgia and AFO brace follow up Pt. states," can only wear the brace for 1-2 hours , if I wear them longer they would get real sore at the back." Tx": AFO brace   68 y.o. female presents with the above complaint. History as above.  Review of Systems: Negative except as noted in the HPI. Denies N/V/F/Ch.  No past medical history on file.  Current Outpatient Medications:  .  anastrozole (ARIMIDEX) 1 MG tablet, , Disp: , Rfl:  .  aspirin EC 81 MG tablet, Take by mouth., Disp: , Rfl:  .  diclofenac sodium (VOLTAREN) 1 % GEL, APPLY 4 GRAMS TO AREA UP TO 4 TIMES DAILY, Disp: , Rfl:  .  divalproex (DEPAKOTE) 500 MG DR tablet, , Disp: , Rfl:  .  fluticasone (FLONASE) 50 MCG/ACT nasal spray, USE 2 SPRAYS IN EACH NOSTRIL EVERY DAY, Disp: , Rfl:  .  furosemide (LASIX) 40 MG tablet, TAKE 1 TABLET BY MOUTH EVERY DAY, Disp: , Rfl:  .  gabapentin (NEURONTIN) 300 MG capsule, , Disp: , Rfl:  .  hydrochlorothiazide (HYDRODIURIL) 12.5 MG tablet, Take 12.5 mg by mouth daily., Disp: , Rfl: 3 .  latanoprost (XALATAN) 0.005 % ophthalmic solution, , Disp: , Rfl:  .  losartan (COZAAR) 100 MG tablet, , Disp: , Rfl:  .  losartan-hydrochlorothiazide (HYZAAR) 100-12.5 MG tablet, TAKE 1 TABLET EVERY MORNING, Disp: , Rfl:  .  meloxicam (MOBIC) 7.5 MG tablet, , Disp: , Rfl:  .  oxybutynin (DITROPAN-XL) 10 MG 24 hr tablet, Take 10 mg by mouth at bedtime., Disp: , Rfl:  .  pantoprazole (PROTONIX) 40 MG tablet, , Disp: , Rfl:  .  potassium chloride SA (KLOR-CON M20) 20 MEQ tablet, TAKE 1 TABLET DAILY, Disp: , Rfl:  .  SYNTHROID 50 MCG tablet, , Disp: , Rfl:   Current Facility-Administered Medications:  .  triamcinolone acetonide (KENALOG-40) injection 20 mg, 20 mg, Other, Once, [place], Titorya, DPM .  triamcinolone acetonide (KENALOG-40)  injection 20 mg, 20 mg, Other, Once, Landis Martins, DPM  Social History   Tobacco Use  Smoking Status Unknown If Ever Smoked  Smokeless Tobacco Never Used    Allergies  Allergen Reactions  . Oxycodone    Objective:  There were no vitals filed for this visit. There is no height or weight on file to calculate BMI. Constitutional Well developed. Well nourished.  Vascular Dorsalis pedis pulses palpable bilaterally. Posterior tibial pulses palpable bilaterally. Capillary refill normal to all digits.  No cyanosis or clubbing noted. Pedal hair growth normal.  Neurologic Normal speech. Oriented to person, place, and time. Epicritic sensation to light touch grossly present bilaterally.  Dermatologic Nails well groomed and normal in appearance. No open wounds. No skin lesions.  Orthopedic: Normal joint ROM without pain or crepitus bilaterally. No visible deformities. ATFL tenderness right No pain at the posterior or plantar calcaneus No pain at the achilles tendon insertion. Achilles tendon intact without dell. Good Achilles strength noted. Pain palpation about the sinus Tarsi right Severe pes planus noted   Radiographs:  None today. Assessment:   1. Primary osteoarthritis of right ankle   2. Arthralgia of hindfoot, right   3. Sinus tarsitis of right foot    Plan:  Patient was evaluated and treated and all questions answered.  Pes  planus with hindfoot arthralgia -Discussed with patient that robbery with orthotist to see if the person adjusted to be more comfortable because she does states that initially the brace helps her.  I think that if we can get the brace for more comfortable this is a better option than considering surgery.  Surgery would be difficult due to her age body habitus and advanced degree of arthritis.  Sinus Tarsitis -Injection as below  Procedure: Injection Intermediate Joint Consent: Verbal consent obtained. Location: Right ST joint  Skin Prep:  Alcohol. Injectate: 1 cc 0.5% marcaine plain, 1 cc dexamethasone phosphate, 0.5 cc kenalog 10. Disposition: Patient tolerated procedure well. Injection site dressed with a band-aid.  No follow-ups on file.

## 2018-09-01 DIAGNOSIS — Z170 Estrogen receptor positive status [ER+]: Secondary | ICD-10-CM | POA: Diagnosis not present

## 2018-09-01 DIAGNOSIS — C50919 Malignant neoplasm of unspecified site of unspecified female breast: Secondary | ICD-10-CM | POA: Diagnosis not present

## 2018-09-01 DIAGNOSIS — Z79811 Long term (current) use of aromatase inhibitors: Secondary | ICD-10-CM | POA: Diagnosis not present

## 2018-09-01 DIAGNOSIS — Z853 Personal history of malignant neoplasm of breast: Secondary | ICD-10-CM | POA: Diagnosis not present

## 2018-10-18 DIAGNOSIS — L309 Dermatitis, unspecified: Secondary | ICD-10-CM | POA: Diagnosis not present

## 2018-10-18 DIAGNOSIS — I10 Essential (primary) hypertension: Secondary | ICD-10-CM | POA: Diagnosis not present

## 2018-10-18 DIAGNOSIS — Z6841 Body Mass Index (BMI) 40.0 and over, adult: Secondary | ICD-10-CM | POA: Diagnosis not present

## 2018-11-25 DIAGNOSIS — M1712 Unilateral primary osteoarthritis, left knee: Secondary | ICD-10-CM | POA: Diagnosis not present

## 2018-11-25 DIAGNOSIS — M1711 Unilateral primary osteoarthritis, right knee: Secondary | ICD-10-CM | POA: Diagnosis not present

## 2019-01-03 DIAGNOSIS — R928 Other abnormal and inconclusive findings on diagnostic imaging of breast: Secondary | ICD-10-CM | POA: Diagnosis not present

## 2019-01-03 DIAGNOSIS — C50111 Malignant neoplasm of central portion of right female breast: Secondary | ICD-10-CM | POA: Diagnosis not present

## 2019-01-11 DIAGNOSIS — Z6841 Body Mass Index (BMI) 40.0 and over, adult: Secondary | ICD-10-CM | POA: Diagnosis not present

## 2019-01-11 DIAGNOSIS — Z170 Estrogen receptor positive status [ER+]: Secondary | ICD-10-CM | POA: Diagnosis not present

## 2019-01-11 DIAGNOSIS — C50111 Malignant neoplasm of central portion of right female breast: Secondary | ICD-10-CM | POA: Diagnosis not present

## 2019-01-20 ENCOUNTER — Other Ambulatory Visit: Payer: Self-pay

## 2019-01-21 DIAGNOSIS — I10 Essential (primary) hypertension: Secondary | ICD-10-CM | POA: Diagnosis not present

## 2019-01-21 DIAGNOSIS — E039 Hypothyroidism, unspecified: Secondary | ICD-10-CM | POA: Diagnosis not present

## 2019-02-02 ENCOUNTER — Ambulatory Visit (INDEPENDENT_AMBULATORY_CARE_PROVIDER_SITE_OTHER): Payer: Medicare Other

## 2019-02-02 ENCOUNTER — Ambulatory Visit (INDEPENDENT_AMBULATORY_CARE_PROVIDER_SITE_OTHER): Payer: Medicare Other | Admitting: Sports Medicine

## 2019-02-02 ENCOUNTER — Other Ambulatory Visit: Payer: Self-pay | Admitting: Sports Medicine

## 2019-02-02 ENCOUNTER — Encounter: Payer: Self-pay | Admitting: Sports Medicine

## 2019-02-02 ENCOUNTER — Other Ambulatory Visit: Payer: Self-pay

## 2019-02-02 DIAGNOSIS — M25571 Pain in right ankle and joints of right foot: Secondary | ICD-10-CM | POA: Diagnosis not present

## 2019-02-02 DIAGNOSIS — M19071 Primary osteoarthritis, right ankle and foot: Secondary | ICD-10-CM | POA: Diagnosis not present

## 2019-02-02 DIAGNOSIS — I872 Venous insufficiency (chronic) (peripheral): Secondary | ICD-10-CM

## 2019-02-02 DIAGNOSIS — M79671 Pain in right foot: Secondary | ICD-10-CM

## 2019-02-02 MED ORDER — TRIAMCINOLONE ACETONIDE 40 MG/ML IJ SUSP
20.0000 mg | Freq: Once | INTRAMUSCULAR | Status: AC
  Administered 2019-02-02: 20 mg

## 2019-02-02 NOTE — Progress Notes (Signed)
Subjective: Kim Nielsen is a 68 y.o. Obese female patient who returns to office for evaluation of  right ankle/foot pain.  Patient states that there is significant pain at the dorsal and lateral foot that never went away and swelling came right back. Denies any other symptoms like nausea, vomiting, fever, chills or any other constitutional symptoms at this time.  Review of Systems  All other systems reviewed and are negative.   Patient Active Problem List   Diagnosis Date Noted  . BMI 40.0-44.9, adult ([place]) 01/08/2016  . Plantar fasciitis 10/30/2015  . Primary osteoarthritis of both feet 10/30/2015  . Tightness of left heel cord 10/30/2015  . Tightness of right heel cord 10/30/2015  . BMI 45.0-49.9, adult ([place]) 08/21/2015  . Cancer of central portion of right female breast ([place]) 08/21/2015  . Estrogen receptor positive tumor status 08/21/2015  . Morbid obesity ([place]) 08/21/2015    Current Outpatient Medications on File Prior to Visit  Medication Sig Dispense Refill  . anastrozole (ARIMIDEX) 1 MG tablet     . aspirin EC 81 MG tablet Take by mouth.    . diclofenac sodium (VOLTAREN) 1 % GEL APPLY 4 GRAMS TO AREA UP TO 4 TIMES DAILY    . divalproex (DEPAKOTE) 500 MG DR tablet     . fluticasone (FLONASE) 50 MCG/ACT nasal spray USE 2 SPRAYS IN EACH NOSTRIL EVERY DAY    . furosemide (LASIX) 40 MG tablet TAKE 1 TABLET BY MOUTH EVERY DAY    . gabapentin (NEURONTIN) 300 MG capsule     . hydrochlorothiazide (HYDRODIURIL) 12.5 MG tablet Take 12.5 mg by mouth daily.  3  . latanoprost (XALATAN) 0.005 % ophthalmic solution     . losartan (COZAAR) 100 MG tablet     . losartan-hydrochlorothiazide (HYZAAR) 100-12.5 MG tablet TAKE 1 TABLET EVERY MORNING    . meloxicam (MOBIC) 7.5 MG tablet     . oxybutynin (DITROPAN-XL) 10 MG 24 hr tablet Take 10 mg by mouth at bedtime.    . pantoprazole (PROTONIX) 40 MG tablet     . potassium chloride SA (KLOR-CON M20) 20 MEQ tablet TAKE 1 TABLET DAILY    .  SYNTHROID 50 MCG tablet      Current Facility-Administered Medications on File Prior to Visit  Medication Dose Route Frequency Provider Last Rate Last Dose  . triamcinolone acetonide (KENALOG-40) injection 20 mg  20 mg Other Once Liberal, Finley Chevez, DPM      . triamcinolone acetonide (KENALOG-40) injection 20 mg  20 mg Other Once Landis Martins, DPM        Allergies  Allergen Reactions  . Oxycodone     Objective:  General: Alert and oriented x3 in no acute distress  Dermatology: No open lesions bilateral lower extremities, no webspace macerations, no ecchymosis bilateral, all nails x 10 are well manicured.  Vascular: Dorsalis Pedis and Posterior Tibial pedal pulses palpable, Capillary Fill Time 3 seconds,(+) pedal hair growth bilateral, + edema focal to the right lateral ankle and excessive fat bilateral lower extremities, Temperature gradient within normal limits.  Neurology: Gross sensation intact via light touch bilateral. (- )Tinels sign right.   Musculoskeletal: There is mild tenderness with palpation at right lateral ankle gutter and along the peroneal tendon course and worse at sinus tarsi. Negative talar tilt, Negative tib-fib stress, No instability. Severe pes planus with lateral ankle impingement right>left.  No pain with calf compression bilateral. Range of motion limited with no guarding on right ankle. Strength within normal limits in  all groups bilateral.  X-rays of the right ankle: Joint space narrowing supportive of arthritis, there is mild soft tissue swelling at the lateral ankle no fracture or dislocation unchanged pes planus deformity with arthritic changes diffusely across the foot no other acute findings.  Assessment and Plan: Problem List Items Addressed This Visit    None    Visit Diagnoses    Sinus tarsitis of right foot    -  Primary   Primary osteoarthritis of right ankle       Venous (peripheral) insufficiency       Right foot pain          -Complete  examination performed -X-rays reviewed -Discussed with patient treatment options of foot pain and continued arthritis in the setting of pes planus which can be causing impingement at the side of the right foot and ankle at sinus tarsi -Patient applied Unna boot to right foot and ankle for edema control -After oral consent and aseptic prep, injected a mixture containing 1 ml of 2%  plain lidocaine, 1 ml 0.5% plain marcaine, 0.5 ml of kenalog 40 and 0.5 ml of dexamethasone phosphate at sinus tarsi without complication. Post-injection care discussed with patient.  -Advised patient to limit activities to tolerance and if after unna boot is removed if swelling is still there then to discuss with PCP about Lasix increase or further work up to help with swelling -Patient to return to office in 2 to 3 weeks or sooner if condition worsens.  Landis Martins, DPM

## 2019-02-16 ENCOUNTER — Other Ambulatory Visit: Payer: Self-pay

## 2019-02-16 ENCOUNTER — Ambulatory Visit: Payer: Medicare Other | Admitting: Orthotics

## 2019-02-16 DIAGNOSIS — M19071 Primary osteoarthritis, right ankle and foot: Secondary | ICD-10-CM

## 2019-02-16 DIAGNOSIS — M7661 Achilles tendinitis, right leg: Secondary | ICD-10-CM

## 2019-02-16 DIAGNOSIS — M25571 Pain in right ankle and joints of right foot: Secondary | ICD-10-CM

## 2019-02-16 DIAGNOSIS — M7671 Peroneal tendinitis, right leg: Secondary | ICD-10-CM

## 2019-02-16 NOTE — Progress Notes (Signed)
Patient complains she can only wear brace about an hour before it starts hurting around the posterior aspect of ankle and medial inferior aspect of calcaneus.    Going to modifiy brace with poron/pcell cushioning to see if that helps

## 2019-02-21 ENCOUNTER — Other Ambulatory Visit: Payer: Self-pay | Admitting: Sports Medicine

## 2019-02-21 DIAGNOSIS — E039 Hypothyroidism, unspecified: Secondary | ICD-10-CM | POA: Diagnosis not present

## 2019-02-21 DIAGNOSIS — I10 Essential (primary) hypertension: Secondary | ICD-10-CM | POA: Diagnosis not present

## 2019-02-21 DIAGNOSIS — M25571 Pain in right ankle and joints of right foot: Secondary | ICD-10-CM

## 2019-03-07 DIAGNOSIS — Z853 Personal history of malignant neoplasm of breast: Secondary | ICD-10-CM | POA: Diagnosis not present

## 2019-03-07 DIAGNOSIS — Z79811 Long term (current) use of aromatase inhibitors: Secondary | ICD-10-CM | POA: Diagnosis not present

## 2019-03-07 DIAGNOSIS — C50111 Malignant neoplasm of central portion of right female breast: Secondary | ICD-10-CM | POA: Diagnosis not present

## 2019-03-14 DIAGNOSIS — Z1331 Encounter for screening for depression: Secondary | ICD-10-CM | POA: Diagnosis not present

## 2019-03-14 DIAGNOSIS — E039 Hypothyroidism, unspecified: Secondary | ICD-10-CM | POA: Diagnosis not present

## 2019-03-14 DIAGNOSIS — Z0000 Encounter for general adult medical examination without abnormal findings: Secondary | ICD-10-CM | POA: Diagnosis not present

## 2019-03-14 DIAGNOSIS — G40309 Generalized idiopathic epilepsy and epileptic syndromes, not intractable, without status epilepticus: Secondary | ICD-10-CM | POA: Diagnosis not present

## 2019-03-14 DIAGNOSIS — Z23 Encounter for immunization: Secondary | ICD-10-CM | POA: Diagnosis not present

## 2019-03-14 DIAGNOSIS — I10 Essential (primary) hypertension: Secondary | ICD-10-CM | POA: Diagnosis not present

## 2019-03-14 DIAGNOSIS — Z79899 Other long term (current) drug therapy: Secondary | ICD-10-CM | POA: Diagnosis not present

## 2019-03-14 DIAGNOSIS — Z6841 Body Mass Index (BMI) 40.0 and over, adult: Secondary | ICD-10-CM | POA: Diagnosis not present

## 2019-03-16 ENCOUNTER — Other Ambulatory Visit: Payer: Medicare Other

## 2019-03-16 ENCOUNTER — Other Ambulatory Visit: Payer: Self-pay

## 2019-03-23 DIAGNOSIS — I10 Essential (primary) hypertension: Secondary | ICD-10-CM | POA: Diagnosis not present

## 2019-03-23 DIAGNOSIS — G40309 Generalized idiopathic epilepsy and epileptic syndromes, not intractable, without status epilepticus: Secondary | ICD-10-CM | POA: Diagnosis not present

## 2019-03-23 DIAGNOSIS — C50911 Malignant neoplasm of unspecified site of right female breast: Secondary | ICD-10-CM | POA: Diagnosis not present

## 2019-03-23 DIAGNOSIS — E039 Hypothyroidism, unspecified: Secondary | ICD-10-CM | POA: Diagnosis not present

## 2019-04-12 DIAGNOSIS — L723 Sebaceous cyst: Secondary | ICD-10-CM | POA: Diagnosis not present

## 2019-04-22 DIAGNOSIS — I10 Essential (primary) hypertension: Secondary | ICD-10-CM | POA: Diagnosis not present

## 2019-04-22 DIAGNOSIS — E039 Hypothyroidism, unspecified: Secondary | ICD-10-CM | POA: Diagnosis not present

## 2019-05-02 DIAGNOSIS — L723 Sebaceous cyst: Secondary | ICD-10-CM | POA: Diagnosis not present

## 2019-05-16 DIAGNOSIS — L723 Sebaceous cyst: Secondary | ICD-10-CM | POA: Diagnosis not present

## 2019-05-17 DIAGNOSIS — E669 Obesity, unspecified: Secondary | ICD-10-CM | POA: Diagnosis not present

## 2019-05-17 DIAGNOSIS — M7989 Other specified soft tissue disorders: Secondary | ICD-10-CM | POA: Diagnosis not present

## 2019-05-17 DIAGNOSIS — Z6838 Body mass index (BMI) 38.0-38.9, adult: Secondary | ICD-10-CM | POA: Diagnosis not present

## 2019-05-30 DIAGNOSIS — Z1331 Encounter for screening for depression: Secondary | ICD-10-CM | POA: Diagnosis not present

## 2019-05-30 DIAGNOSIS — M25473 Effusion, unspecified ankle: Secondary | ICD-10-CM | POA: Diagnosis not present

## 2019-05-30 DIAGNOSIS — Z6841 Body Mass Index (BMI) 40.0 and over, adult: Secondary | ICD-10-CM | POA: Diagnosis not present

## 2019-06-06 ENCOUNTER — Other Ambulatory Visit: Payer: Self-pay

## 2019-06-06 ENCOUNTER — Encounter: Payer: Self-pay | Admitting: *Deleted

## 2019-06-06 ENCOUNTER — Other Ambulatory Visit: Payer: Self-pay | Admitting: *Deleted

## 2019-06-06 ENCOUNTER — Ambulatory Visit (INDEPENDENT_AMBULATORY_CARE_PROVIDER_SITE_OTHER): Payer: Medicare Other | Admitting: Diagnostic Neuroimaging

## 2019-06-06 VITALS — BP 108/60 | HR 81 | Temp 97.7°F | Ht 63.0 in | Wt 236.0 lb

## 2019-06-06 DIAGNOSIS — G8929 Other chronic pain: Secondary | ICD-10-CM

## 2019-06-06 DIAGNOSIS — M25561 Pain in right knee: Secondary | ICD-10-CM

## 2019-06-06 DIAGNOSIS — R269 Unspecified abnormalities of gait and mobility: Secondary | ICD-10-CM | POA: Diagnosis not present

## 2019-06-06 DIAGNOSIS — M25562 Pain in left knee: Secondary | ICD-10-CM | POA: Diagnosis not present

## 2019-06-06 DIAGNOSIS — R251 Tremor, unspecified: Secondary | ICD-10-CM

## 2019-06-06 NOTE — Patient Instructions (Signed)
SEIZURE DISORDER (complex partial seizures; started in 1995; stable on depakote and gabapentin; last seizures in 1995) - continue depakote and gabapentin; could consider to reduce depakote in future and continue gabapentin  TREMOR (? Essential tremor, medication side effect, other secondary cause) - check MRI brain  - (not likely depakote related tremor since she has been on medication since 1995 without issues until 2020)  GAIT DIFFICULTY (lumbar spinal stenosis, knee arthritis, deconditioning, obesity) - follow up with orthopedic surgery - consider PT evaluation

## 2019-06-06 NOTE — Progress Notes (Signed)
[facility]  PATIENT: Kim Nielsen DOB: 1951/05/17  REFERRING CLINICIAN: Aris Everts HISTORY FROM: patient  REASON FOR VISIT: new consult    HISTORICAL  CHIEF COMPLAINT:  Chief Complaint  Patient presents with  . Tremors    rm 6 New Pt, husband- Iona Beard "do I need to continue taking Depakote?"    HISTORY OF PRESENT ILLNESS:   68 year old female here for evaluation of tremor, gait difficulty, seizure disorder.  1995 patient had episodes of staring spells and confusion at work.  This was followed by generalized convulsive seizure.  Patient went to neurologist was diagnosed with seizure disorder started on Depakote.  Patient continued have seizures and then gabapentin was added.  Following this patient had no further seizures.  Patient total of 6 or 7 seizures in 1995.  No further seizures by the end of that year.  Patient tolerated medications well without any difficulty.  Patient continued on these medications for 25 years.  In the last 2 to 3 months patient having new problems of mild tremor in the hands, gait and balance difficulty, falls.  She denies any low back pain.  She has some significant bilateral foot and bilateral knee pain and arthritis.  Patient has been sitting more and becoming more sedentary.  Now patient is using a walker.  Tremor is mild and does not bother that much.  No family history of tremor.  Patient also having some increased fatigue, confusion and memory issues according to husband.   REVIEW OF SYSTEMS: Full 14 system review of systems performed and negative with exception of: As per HPI.  ALLERGIES: Allergies  Allergen Reactions  . Oxycodone     HOME MEDICATIONS: Outpatient Medications Prior to Visit  Medication Sig Dispense Refill  . anastrozole (ARIMIDEX) 1 MG tablet     . aspirin EC 81 MG tablet Take by mouth.    . Cholecalciferol (VITAMIN D3 PO) Take 2,000 Units by mouth daily.    . COD LIVER OIL/VITAMINS A & D PO Take by  mouth.    . diclofenac Sodium (VOLTAREN) 1 % GEL APPLY 4 GRAMS TO AREA UP TO 4 TIMES DAILY    . divalproex (DEPAKOTE) 500 MG DR tablet     . fluticasone (FLONASE) 50 MCG/ACT nasal spray USE 2 SPRAYS IN EACH NOSTRIL EVERY DAY    . furosemide (LASIX) 40 MG tablet TAKE 1 TABLET BY MOUTH EVERY DAY    . gabapentin (NEURONTIN) 300 MG capsule     . glucosamine-chondroitin 500-400 MG tablet Take 1 tablet by mouth 3 (three) times daily.    . IRON CR PO Take by mouth. Mon Wed, Fri    . latanoprost (XALATAN) 0.005 % ophthalmic solution     . losartan-hydrochlorothiazide (HYZAAR) 100-12.5 MG tablet TAKE 1 TABLET EVERY MORNING    . meloxicam (MOBIC) 7.5 MG tablet     . metolazone (ZAROXOLYN) 2.5 MG tablet Take 2.5 mg by mouth 3 (three) times a week.    . Multiple Vitamin (MULTIVITAMIN WITH MINERALS) TABS tablet Take 1 tablet by mouth daily.    Marland Kitchen oxybutynin (DITROPAN XL) 15 MG 24 hr tablet Take 15 mg by mouth daily.    . pantoprazole (PROTONIX) 40 MG tablet     . potassium chloride SA (KLOR-CON M20) 20 MEQ tablet TAKE 1 TABLET DAILY    . SYNTHROID 50 MCG tablet     . telmisartan (MICARDIS) 80 MG tablet 80 mg daily.    Marland Kitchen torsemide (DEMADEX) 20 MG tablet Take  20 mg by mouth 2 (two) times daily.    . vitamin E 400 UNIT capsule Take 400 Units by mouth daily.    . diclofenac sodium (VOLTAREN) 1 % GEL APPLY 4 GRAMS TO AREA UP TO 4 TIMES DAILY    . spironolactone (ALDACTONE) 25 MG tablet Take 25 mg by mouth daily as needed.    . hydrochlorothiazide (HYDRODIURIL) 12.5 MG tablet Take 12.5 mg by mouth daily.  3  . losartan (COZAAR) 100 MG tablet      Facility-Administered Medications Prior to Visit  Medication Dose Route Frequency Provider Last Rate Last Admin  . triamcinolone acetonide (KENALOG-40) injection 20 mg  20 mg Other Once [place], Titorya, DPM      . triamcinolone acetonide (KENALOG-40) injection 20 mg  20 mg Other Once Landis Martins, DPM        PAST MEDICAL HISTORY: Past Medical History:    Diagnosis Date  . Apnea   . Breast cancer ([place]) 12/2014   right  . Hypertension   . Tremors of nervous system     PAST SURGICAL HISTORY: Past Surgical History:  Procedure Laterality Date  . APPENDECTOMY    . BREAST BIOPSY Right 12/2014  . catherization  2012   of heart  . MENISCUS REPAIR Left 2004  . TOTAL ABDOMINAL HYSTERECTOMY  1987    FAMILY HISTORY: Family History  Problem Relation Age of Onset  . Hypertension Mother   . Hypertension Father   . Diabetes Brother     SOCIAL HISTORY: Social History   Socioeconomic History  . Marital status: Married    Spouse name: Iona Beard  . Number of children: 2  . Years of education: Not on file  . Highest education level: High school graduate  Occupational History    Comment: daycare  Tobacco Use  . Smoking status: Never Smoker  . Smokeless tobacco: Never Used  Substance and Sexual Activity  . Alcohol use: Never  . Drug use: Never  . Sexual activity: Not on file  Other Topics Concern  . Not on file  Social History Narrative   Lives with husband   Social Determinants of Health   Financial Resource Strain:   . Difficulty of Paying Living Expenses: Not on file  Food Insecurity:   . Worried About Charity fundraiser in the Last Year: Not on file  . Ran Out of Food in the Last Year: Not on file  Transportation Needs:   . Lack of Transportation (Medical): Not on file  . Lack of Transportation (Non-Medical): Not on file  Physical Activity:   . Days of Exercise per Week: Not on file  . Minutes of Exercise per Session: Not on file  Stress:   . Feeling of Stress : Not on file  Social Connections:   . Frequency of Communication with Friends and Family: Not on file  . Frequency of Social Gatherings with Friends and Family: Not on file  . Attends Religious Services: Not on file  . Active Member of Clubs or Organizations: Not on file  . Attends Archivist Meetings: Not on file  . Marital Status: Not on file   Intimate Partner Violence:   . Fear of Current or Ex-Partner: Not on file  . Emotionally Abused: Not on file  . Physically Abused: Not on file  . Sexually Abused: Not on file     PHYSICAL EXAM  GENERAL EXAM/CONSTITUTIONAL: Vitals:  Vitals:   06/06/19 1608  BP: 108/60  Pulse: 81  Temp: 97.7 F (36.5 C)  Weight: 236 lb (107 kg)  Height: 5\' 3"  (1.6 m)     Body mass index is 41.81 kg/m. Wt Readings from Last 3 Encounters:  06/06/19 236 lb (107 kg)     Patient is in no distress; well developed, nourished and groomed; neck is supple  CARDIOVASCULAR:  Examination of carotid arteries is normal; no carotid bruits  Regular rate and rhythm, no murmurs  Examination of peripheral vascular system by observation and palpation is normal  EYES:  Ophthalmoscopic exam of optic discs and posterior segments is normal; no papilledema or hemorrhages  No exam data present  MUSCULOSKELETAL:  Gait, strength, tone, movements noted in Neurologic exam below  NEUROLOGIC: MENTAL STATUS:  No flowsheet data found.  awake, alert, oriented to person, place and time  recent and remote memory intact  normal attention and concentration  language fluent, comprehension intact, naming intact  fund of knowledge appropriate  CRANIAL NERVE:   2nd - no papilledema on fundoscopic exam  2nd, 3rd, 4th, 6th - pupils equal and reactive to light, visual fields full to confrontation, extraocular muscles intact, no nystagmus  5th - facial sensation symmetric  7th - facial strength symmetric  8th - hearing intact  9th - palate elevates symmetrically, uvula midline  11th - shoulder shrug symmetric  12th - tongue protrusion midline  MOTOR:   normal bulk and tone, full strength in the BUE  MILD POSTURAL TREMOR IN LUE  BLE 4 PROX AND 4 DISTAL  SENSORY:   normal and symmetric to light touch, temperature, vibration  COORDINATION:   finger-nose-finger, fine finger movements  normal  REFLEXES:   deep tendon reflexes TRACE and symmetric  GAIT/STATION:   SLOW TO RISE; UNSTEADY; ANTALGIC SLOW GAIT     DIAGNOSTIC DATA (LABS, IMAGING, TESTING) - I reviewed patient records, labs, notes, testing and imaging myself where available.  No results found for: WBC, HGB, HCT, MCV, PLT No results found for: NA, K, CL, CO2, GLUCOSE, BUN, CREATININE, CALCIUM, PROT, ALBUMIN, AST, ALT, ALKPHOS, BILITOT, GFRNONAA, GFRAA No results found for: CHOL, HDL, LDLCALC, LDLDIRECT, TRIG, CHOLHDL No results found for: HGBA1C No results found for: VITAMINB12 No results found for: TSH      ASSESSMENT AND PLAN  68 y.o. year old female here with:  Dx:  1. Tremor   2. Gait difficulty   3. Chronic pain of both knees      PLAN:  SEIZURE DISORDER (complex partial seizures; started in 1995; stable on depakote and gabapentin; last seizures in 1995) - continue depakote and gabapentin; could consider to reduce depakote in future and continue gabapentin  TREMOR (? Essential tremor, medication side effect, other secondary cause) - check MRI brain  - (not likely depakote related tremor since she has been on medication since 1995 without issues until 2020)  GAIT DIFFICULTY (lumbar spinal stenosis, knee arthritis, deconditioning, obesity) - follow up with orthopedic surgery - consider PT evaluation  Orders Placed This Encounter  Procedures  . MR BRAIN W WO CONTRAST   Return pending test results, for pending if symptoms worsen or fail to improve.    Penni Bombard, MD [id number]/[id number], 1:96 PM Certified in Neurology, Neurophysiology and Neuroimaging  [facility] [address], [place], [place] [address] [phone]

## 2019-06-23 DIAGNOSIS — K219 Gastro-esophageal reflux disease without esophagitis: Secondary | ICD-10-CM | POA: Diagnosis not present

## 2019-06-23 DIAGNOSIS — I10 Essential (primary) hypertension: Secondary | ICD-10-CM | POA: Diagnosis not present

## 2019-06-23 DIAGNOSIS — E039 Hypothyroidism, unspecified: Secondary | ICD-10-CM | POA: Diagnosis not present

## 2019-06-28 ENCOUNTER — Ambulatory Visit
Admission: RE | Admit: 2019-06-28 | Discharge: 2019-06-28 | Disposition: A | Discharge status: Home / Self Care | Payer: Medicare Other | Source: Ambulatory Visit | Attending: Diagnostic Neuroimaging | Admitting: Diagnostic Neuroimaging

## 2019-06-28 ENCOUNTER — Other Ambulatory Visit: Payer: Self-pay | Admitting: Diagnostic Neuroimaging

## 2019-06-28 ENCOUNTER — Other Ambulatory Visit: Payer: Self-pay

## 2019-06-28 DIAGNOSIS — R269 Unspecified abnormalities of gait and mobility: Secondary | ICD-10-CM

## 2019-06-28 DIAGNOSIS — R251 Tremor, unspecified: Secondary | ICD-10-CM

## 2019-06-30 ENCOUNTER — Telehealth: Payer: Self-pay | Admitting: *Deleted

## 2019-06-30 NOTE — Telephone Encounter (Signed)
Called patient and LVM informing her MRI is unremarkable, looks okay. Left # for questions, advised we are still closed on Fri due to Covid.

## 2019-08-01 DIAGNOSIS — R251 Tremor, unspecified: Secondary | ICD-10-CM | POA: Diagnosis not present

## 2019-08-01 DIAGNOSIS — Z90710 Acquired absence of both cervix and uterus: Secondary | ICD-10-CM | POA: Diagnosis not present

## 2019-08-01 DIAGNOSIS — Z79899 Other long term (current) drug therapy: Secondary | ICD-10-CM | POA: Diagnosis not present

## 2019-08-01 DIAGNOSIS — Z853 Personal history of malignant neoplasm of breast: Secondary | ICD-10-CM | POA: Diagnosis not present

## 2019-08-01 DIAGNOSIS — Z7982 Long term (current) use of aspirin: Secondary | ICD-10-CM | POA: Diagnosis not present

## 2019-08-01 DIAGNOSIS — M25473 Effusion, unspecified ankle: Secondary | ICD-10-CM | POA: Diagnosis not present

## 2019-08-01 DIAGNOSIS — Z79811 Long term (current) use of aromatase inhibitors: Secondary | ICD-10-CM | POA: Diagnosis not present

## 2019-08-01 DIAGNOSIS — I10 Essential (primary) hypertension: Secondary | ICD-10-CM | POA: Diagnosis not present

## 2019-08-01 DIAGNOSIS — Z791 Long term (current) use of non-steroidal anti-inflammatories (NSAID): Secondary | ICD-10-CM | POA: Diagnosis not present

## 2019-08-01 DIAGNOSIS — Z6841 Body Mass Index (BMI) 40.0 and over, adult: Secondary | ICD-10-CM | POA: Diagnosis not present

## 2019-08-01 DIAGNOSIS — R600 Localized edema: Secondary | ICD-10-CM | POA: Diagnosis not present

## 2019-08-01 DIAGNOSIS — G4733 Obstructive sleep apnea (adult) (pediatric): Secondary | ICD-10-CM | POA: Diagnosis not present

## 2019-08-01 DIAGNOSIS — I259 Chronic ischemic heart disease, unspecified: Secondary | ICD-10-CM | POA: Diagnosis not present

## 2019-08-01 DIAGNOSIS — Z9989 Dependence on other enabling machines and devices: Secondary | ICD-10-CM | POA: Diagnosis not present

## 2019-08-02 DIAGNOSIS — I10 Essential (primary) hypertension: Secondary | ICD-10-CM | POA: Diagnosis not present

## 2019-08-02 DIAGNOSIS — I259 Chronic ischemic heart disease, unspecified: Secondary | ICD-10-CM | POA: Diagnosis not present

## 2019-08-02 DIAGNOSIS — R600 Localized edema: Secondary | ICD-10-CM | POA: Diagnosis not present

## 2019-08-02 DIAGNOSIS — R251 Tremor, unspecified: Secondary | ICD-10-CM | POA: Diagnosis not present

## 2019-08-02 DIAGNOSIS — G4733 Obstructive sleep apnea (adult) (pediatric): Secondary | ICD-10-CM | POA: Diagnosis not present

## 2019-08-03 DIAGNOSIS — I259 Chronic ischemic heart disease, unspecified: Secondary | ICD-10-CM | POA: Diagnosis not present

## 2019-08-04 DIAGNOSIS — I259 Chronic ischemic heart disease, unspecified: Secondary | ICD-10-CM | POA: Diagnosis not present

## 2019-08-04 DIAGNOSIS — I10 Essential (primary) hypertension: Secondary | ICD-10-CM | POA: Diagnosis not present

## 2019-08-04 DIAGNOSIS — R600 Localized edema: Secondary | ICD-10-CM | POA: Diagnosis not present

## 2019-08-04 DIAGNOSIS — R251 Tremor, unspecified: Secondary | ICD-10-CM | POA: Diagnosis not present

## 2019-08-04 DIAGNOSIS — G4733 Obstructive sleep apnea (adult) (pediatric): Secondary | ICD-10-CM | POA: Diagnosis not present

## 2019-08-05 DIAGNOSIS — M7989 Other specified soft tissue disorders: Secondary | ICD-10-CM | POA: Diagnosis not present

## 2019-08-05 DIAGNOSIS — I259 Chronic ischemic heart disease, unspecified: Secondary | ICD-10-CM | POA: Diagnosis not present

## 2019-08-08 DIAGNOSIS — R600 Localized edema: Secondary | ICD-10-CM | POA: Diagnosis not present

## 2019-08-08 DIAGNOSIS — G4733 Obstructive sleep apnea (adult) (pediatric): Secondary | ICD-10-CM | POA: Diagnosis not present

## 2019-08-08 DIAGNOSIS — R251 Tremor, unspecified: Secondary | ICD-10-CM | POA: Diagnosis not present

## 2019-08-08 DIAGNOSIS — I10 Essential (primary) hypertension: Secondary | ICD-10-CM | POA: Diagnosis not present

## 2019-08-08 DIAGNOSIS — I259 Chronic ischemic heart disease, unspecified: Secondary | ICD-10-CM | POA: Diagnosis not present

## 2019-08-10 DIAGNOSIS — I10 Essential (primary) hypertension: Secondary | ICD-10-CM | POA: Diagnosis not present

## 2019-08-10 DIAGNOSIS — G4733 Obstructive sleep apnea (adult) (pediatric): Secondary | ICD-10-CM | POA: Diagnosis not present

## 2019-08-10 DIAGNOSIS — I259 Chronic ischemic heart disease, unspecified: Secondary | ICD-10-CM | POA: Diagnosis not present

## 2019-08-10 DIAGNOSIS — R600 Localized edema: Secondary | ICD-10-CM | POA: Diagnosis not present

## 2019-08-10 DIAGNOSIS — R251 Tremor, unspecified: Secondary | ICD-10-CM | POA: Diagnosis not present

## 2019-08-11 DIAGNOSIS — I259 Chronic ischemic heart disease, unspecified: Secondary | ICD-10-CM | POA: Diagnosis not present

## 2019-08-11 DIAGNOSIS — R251 Tremor, unspecified: Secondary | ICD-10-CM | POA: Diagnosis not present

## 2019-08-11 DIAGNOSIS — G4733 Obstructive sleep apnea (adult) (pediatric): Secondary | ICD-10-CM | POA: Diagnosis not present

## 2019-08-11 DIAGNOSIS — R600 Localized edema: Secondary | ICD-10-CM | POA: Diagnosis not present

## 2019-08-11 DIAGNOSIS — I10 Essential (primary) hypertension: Secondary | ICD-10-CM | POA: Diagnosis not present

## 2019-08-12 DIAGNOSIS — R251 Tremor, unspecified: Secondary | ICD-10-CM | POA: Diagnosis not present

## 2019-08-12 DIAGNOSIS — I10 Essential (primary) hypertension: Secondary | ICD-10-CM | POA: Diagnosis not present

## 2019-08-12 DIAGNOSIS — I259 Chronic ischemic heart disease, unspecified: Secondary | ICD-10-CM | POA: Diagnosis not present

## 2019-08-12 DIAGNOSIS — R600 Localized edema: Secondary | ICD-10-CM | POA: Diagnosis not present

## 2019-08-12 DIAGNOSIS — E559 Vitamin D deficiency, unspecified: Secondary | ICD-10-CM | POA: Diagnosis not present

## 2019-08-12 DIAGNOSIS — G4733 Obstructive sleep apnea (adult) (pediatric): Secondary | ICD-10-CM | POA: Diagnosis not present

## 2019-08-12 DIAGNOSIS — Z79899 Other long term (current) drug therapy: Secondary | ICD-10-CM | POA: Diagnosis not present

## 2019-08-15 DIAGNOSIS — I10 Essential (primary) hypertension: Secondary | ICD-10-CM | POA: Diagnosis not present

## 2019-08-15 DIAGNOSIS — I259 Chronic ischemic heart disease, unspecified: Secondary | ICD-10-CM | POA: Diagnosis not present

## 2019-08-15 DIAGNOSIS — G4733 Obstructive sleep apnea (adult) (pediatric): Secondary | ICD-10-CM | POA: Diagnosis not present

## 2019-08-15 DIAGNOSIS — R600 Localized edema: Secondary | ICD-10-CM | POA: Diagnosis not present

## 2019-08-15 DIAGNOSIS — R251 Tremor, unspecified: Secondary | ICD-10-CM | POA: Diagnosis not present

## 2019-08-17 DIAGNOSIS — R600 Localized edema: Secondary | ICD-10-CM | POA: Diagnosis not present

## 2019-08-17 DIAGNOSIS — I259 Chronic ischemic heart disease, unspecified: Secondary | ICD-10-CM | POA: Diagnosis not present

## 2019-08-17 DIAGNOSIS — I10 Essential (primary) hypertension: Secondary | ICD-10-CM | POA: Diagnosis not present

## 2019-08-17 DIAGNOSIS — G4733 Obstructive sleep apnea (adult) (pediatric): Secondary | ICD-10-CM | POA: Diagnosis not present

## 2019-08-17 DIAGNOSIS — R251 Tremor, unspecified: Secondary | ICD-10-CM | POA: Diagnosis not present

## 2019-08-18 DIAGNOSIS — I10 Essential (primary) hypertension: Secondary | ICD-10-CM | POA: Diagnosis not present

## 2019-08-18 DIAGNOSIS — R251 Tremor, unspecified: Secondary | ICD-10-CM | POA: Diagnosis not present

## 2019-08-18 DIAGNOSIS — R600 Localized edema: Secondary | ICD-10-CM | POA: Diagnosis not present

## 2019-08-18 DIAGNOSIS — G4733 Obstructive sleep apnea (adult) (pediatric): Secondary | ICD-10-CM | POA: Diagnosis not present

## 2019-08-18 DIAGNOSIS — I259 Chronic ischemic heart disease, unspecified: Secondary | ICD-10-CM | POA: Diagnosis not present

## 2019-08-19 DIAGNOSIS — R251 Tremor, unspecified: Secondary | ICD-10-CM | POA: Diagnosis not present

## 2019-08-19 DIAGNOSIS — R600 Localized edema: Secondary | ICD-10-CM | POA: Diagnosis not present

## 2019-08-19 DIAGNOSIS — I10 Essential (primary) hypertension: Secondary | ICD-10-CM | POA: Diagnosis not present

## 2019-08-19 DIAGNOSIS — I259 Chronic ischemic heart disease, unspecified: Secondary | ICD-10-CM | POA: Diagnosis not present

## 2019-08-19 DIAGNOSIS — G4733 Obstructive sleep apnea (adult) (pediatric): Secondary | ICD-10-CM | POA: Diagnosis not present

## 2019-08-21 DIAGNOSIS — E7800 Pure hypercholesterolemia, unspecified: Secondary | ICD-10-CM | POA: Diagnosis not present

## 2019-08-21 DIAGNOSIS — I10 Essential (primary) hypertension: Secondary | ICD-10-CM | POA: Diagnosis not present

## 2019-08-21 DIAGNOSIS — E1165 Type 2 diabetes mellitus with hyperglycemia: Secondary | ICD-10-CM | POA: Diagnosis not present

## 2019-08-22 DIAGNOSIS — I259 Chronic ischemic heart disease, unspecified: Secondary | ICD-10-CM | POA: Diagnosis not present

## 2019-08-22 DIAGNOSIS — R251 Tremor, unspecified: Secondary | ICD-10-CM | POA: Diagnosis not present

## 2019-08-22 DIAGNOSIS — R600 Localized edema: Secondary | ICD-10-CM | POA: Diagnosis not present

## 2019-08-22 DIAGNOSIS — G4733 Obstructive sleep apnea (adult) (pediatric): Secondary | ICD-10-CM | POA: Diagnosis not present

## 2019-08-22 DIAGNOSIS — I10 Essential (primary) hypertension: Secondary | ICD-10-CM | POA: Diagnosis not present

## 2019-08-24 DIAGNOSIS — G4733 Obstructive sleep apnea (adult) (pediatric): Secondary | ICD-10-CM | POA: Diagnosis not present

## 2019-08-24 DIAGNOSIS — R600 Localized edema: Secondary | ICD-10-CM | POA: Diagnosis not present

## 2019-08-24 DIAGNOSIS — R251 Tremor, unspecified: Secondary | ICD-10-CM | POA: Diagnosis not present

## 2019-08-24 DIAGNOSIS — I10 Essential (primary) hypertension: Secondary | ICD-10-CM | POA: Diagnosis not present

## 2019-08-24 DIAGNOSIS — I259 Chronic ischemic heart disease, unspecified: Secondary | ICD-10-CM | POA: Diagnosis not present

## 2019-08-25 DIAGNOSIS — R251 Tremor, unspecified: Secondary | ICD-10-CM | POA: Diagnosis not present

## 2019-08-25 DIAGNOSIS — R600 Localized edema: Secondary | ICD-10-CM | POA: Diagnosis not present

## 2019-08-25 DIAGNOSIS — R419 Unspecified symptoms and signs involving cognitive functions and awareness: Secondary | ICD-10-CM | POA: Diagnosis not present

## 2019-08-25 DIAGNOSIS — M6281 Muscle weakness (generalized): Secondary | ICD-10-CM | POA: Diagnosis not present

## 2019-08-25 DIAGNOSIS — G4733 Obstructive sleep apnea (adult) (pediatric): Secondary | ICD-10-CM | POA: Diagnosis not present

## 2019-08-25 DIAGNOSIS — R2689 Other abnormalities of gait and mobility: Secondary | ICD-10-CM | POA: Diagnosis not present

## 2019-08-25 DIAGNOSIS — I10 Essential (primary) hypertension: Secondary | ICD-10-CM | POA: Diagnosis not present

## 2019-08-25 DIAGNOSIS — I259 Chronic ischemic heart disease, unspecified: Secondary | ICD-10-CM | POA: Diagnosis not present

## 2019-08-26 DIAGNOSIS — I259 Chronic ischemic heart disease, unspecified: Secondary | ICD-10-CM | POA: Diagnosis not present

## 2019-08-26 DIAGNOSIS — G4733 Obstructive sleep apnea (adult) (pediatric): Secondary | ICD-10-CM | POA: Diagnosis not present

## 2019-08-26 DIAGNOSIS — R251 Tremor, unspecified: Secondary | ICD-10-CM | POA: Diagnosis not present

## 2019-08-26 DIAGNOSIS — R600 Localized edema: Secondary | ICD-10-CM | POA: Diagnosis not present

## 2019-08-26 DIAGNOSIS — I10 Essential (primary) hypertension: Secondary | ICD-10-CM | POA: Diagnosis not present

## 2019-08-29 DIAGNOSIS — R600 Localized edema: Secondary | ICD-10-CM | POA: Diagnosis not present

## 2019-08-29 DIAGNOSIS — I10 Essential (primary) hypertension: Secondary | ICD-10-CM | POA: Diagnosis not present

## 2019-08-29 DIAGNOSIS — I259 Chronic ischemic heart disease, unspecified: Secondary | ICD-10-CM | POA: Diagnosis not present

## 2019-08-29 DIAGNOSIS — G4733 Obstructive sleep apnea (adult) (pediatric): Secondary | ICD-10-CM | POA: Diagnosis not present

## 2019-08-29 DIAGNOSIS — R251 Tremor, unspecified: Secondary | ICD-10-CM | POA: Diagnosis not present

## 2019-08-30 DIAGNOSIS — Z79899 Other long term (current) drug therapy: Secondary | ICD-10-CM | POA: Diagnosis not present

## 2019-08-31 DIAGNOSIS — I10 Essential (primary) hypertension: Secondary | ICD-10-CM | POA: Diagnosis not present

## 2019-08-31 DIAGNOSIS — R251 Tremor, unspecified: Secondary | ICD-10-CM | POA: Diagnosis not present

## 2019-08-31 DIAGNOSIS — M25473 Effusion, unspecified ankle: Secondary | ICD-10-CM | POA: Diagnosis not present

## 2019-08-31 DIAGNOSIS — Z79899 Other long term (current) drug therapy: Secondary | ICD-10-CM | POA: Diagnosis not present

## 2019-08-31 DIAGNOSIS — G4733 Obstructive sleep apnea (adult) (pediatric): Secondary | ICD-10-CM | POA: Diagnosis not present

## 2019-08-31 DIAGNOSIS — Z7982 Long term (current) use of aspirin: Secondary | ICD-10-CM | POA: Diagnosis not present

## 2019-08-31 DIAGNOSIS — Z791 Long term (current) use of non-steroidal anti-inflammatories (NSAID): Secondary | ICD-10-CM | POA: Diagnosis not present

## 2019-08-31 DIAGNOSIS — I259 Chronic ischemic heart disease, unspecified: Secondary | ICD-10-CM | POA: Diagnosis not present

## 2019-08-31 DIAGNOSIS — Z9989 Dependence on other enabling machines and devices: Secondary | ICD-10-CM | POA: Diagnosis not present

## 2019-08-31 DIAGNOSIS — Z79811 Long term (current) use of aromatase inhibitors: Secondary | ICD-10-CM | POA: Diagnosis not present

## 2019-08-31 DIAGNOSIS — Z6841 Body Mass Index (BMI) 40.0 and over, adult: Secondary | ICD-10-CM | POA: Diagnosis not present

## 2019-08-31 DIAGNOSIS — Z90710 Acquired absence of both cervix and uterus: Secondary | ICD-10-CM | POA: Diagnosis not present

## 2019-08-31 DIAGNOSIS — R600 Localized edema: Secondary | ICD-10-CM | POA: Diagnosis not present

## 2019-08-31 DIAGNOSIS — Z853 Personal history of malignant neoplasm of breast: Secondary | ICD-10-CM | POA: Diagnosis not present

## 2019-09-01 DIAGNOSIS — R251 Tremor, unspecified: Secondary | ICD-10-CM | POA: Diagnosis not present

## 2019-09-01 DIAGNOSIS — G4733 Obstructive sleep apnea (adult) (pediatric): Secondary | ICD-10-CM | POA: Diagnosis not present

## 2019-09-01 DIAGNOSIS — R600 Localized edema: Secondary | ICD-10-CM | POA: Diagnosis not present

## 2019-09-01 DIAGNOSIS — I10 Essential (primary) hypertension: Secondary | ICD-10-CM | POA: Diagnosis not present

## 2019-09-01 DIAGNOSIS — I259 Chronic ischemic heart disease, unspecified: Secondary | ICD-10-CM | POA: Diagnosis not present

## 2019-09-06 DIAGNOSIS — M47813 Spondylosis without myelopathy or radiculopathy, cervicothoracic region: Secondary | ICD-10-CM | POA: Diagnosis not present

## 2019-09-06 DIAGNOSIS — M5021 Other cervical disc displacement,  high cervical region: Secondary | ICD-10-CM | POA: Diagnosis not present

## 2019-09-06 DIAGNOSIS — M47812 Spondylosis without myelopathy or radiculopathy, cervical region: Secondary | ICD-10-CM | POA: Diagnosis not present

## 2019-09-06 DIAGNOSIS — M6281 Muscle weakness (generalized): Secondary | ICD-10-CM | POA: Diagnosis not present

## 2019-09-07 DIAGNOSIS — R251 Tremor, unspecified: Secondary | ICD-10-CM | POA: Diagnosis not present

## 2019-09-07 DIAGNOSIS — Z79811 Long term (current) use of aromatase inhibitors: Secondary | ICD-10-CM | POA: Diagnosis not present

## 2019-09-07 DIAGNOSIS — G4733 Obstructive sleep apnea (adult) (pediatric): Secondary | ICD-10-CM | POA: Diagnosis not present

## 2019-09-07 DIAGNOSIS — Z853 Personal history of malignant neoplasm of breast: Secondary | ICD-10-CM | POA: Diagnosis not present

## 2019-09-07 DIAGNOSIS — C50111 Malignant neoplasm of central portion of right female breast: Secondary | ICD-10-CM | POA: Diagnosis not present

## 2019-09-07 DIAGNOSIS — R600 Localized edema: Secondary | ICD-10-CM | POA: Diagnosis not present

## 2019-09-07 DIAGNOSIS — I259 Chronic ischemic heart disease, unspecified: Secondary | ICD-10-CM | POA: Diagnosis not present

## 2019-09-07 DIAGNOSIS — I10 Essential (primary) hypertension: Secondary | ICD-10-CM | POA: Diagnosis not present

## 2019-09-09 DIAGNOSIS — G4733 Obstructive sleep apnea (adult) (pediatric): Secondary | ICD-10-CM | POA: Diagnosis not present

## 2019-09-09 DIAGNOSIS — R251 Tremor, unspecified: Secondary | ICD-10-CM | POA: Diagnosis not present

## 2019-09-09 DIAGNOSIS — R600 Localized edema: Secondary | ICD-10-CM | POA: Diagnosis not present

## 2019-09-09 DIAGNOSIS — I10 Essential (primary) hypertension: Secondary | ICD-10-CM | POA: Diagnosis not present

## 2019-09-09 DIAGNOSIS — I259 Chronic ischemic heart disease, unspecified: Secondary | ICD-10-CM | POA: Diagnosis not present

## 2019-09-12 DIAGNOSIS — G4733 Obstructive sleep apnea (adult) (pediatric): Secondary | ICD-10-CM | POA: Diagnosis not present

## 2019-09-12 DIAGNOSIS — R251 Tremor, unspecified: Secondary | ICD-10-CM | POA: Diagnosis not present

## 2019-09-12 DIAGNOSIS — R600 Localized edema: Secondary | ICD-10-CM | POA: Diagnosis not present

## 2019-09-12 DIAGNOSIS — I259 Chronic ischemic heart disease, unspecified: Secondary | ICD-10-CM | POA: Diagnosis not present

## 2019-09-12 DIAGNOSIS — I10 Essential (primary) hypertension: Secondary | ICD-10-CM | POA: Diagnosis not present

## 2019-09-16 DIAGNOSIS — R251 Tremor, unspecified: Secondary | ICD-10-CM | POA: Diagnosis not present

## 2019-09-16 DIAGNOSIS — I259 Chronic ischemic heart disease, unspecified: Secondary | ICD-10-CM | POA: Diagnosis not present

## 2019-09-16 DIAGNOSIS — G4733 Obstructive sleep apnea (adult) (pediatric): Secondary | ICD-10-CM | POA: Diagnosis not present

## 2019-09-16 DIAGNOSIS — R600 Localized edema: Secondary | ICD-10-CM | POA: Diagnosis not present

## 2019-09-16 DIAGNOSIS — I10 Essential (primary) hypertension: Secondary | ICD-10-CM | POA: Diagnosis not present

## 2019-09-20 DIAGNOSIS — R251 Tremor, unspecified: Secondary | ICD-10-CM | POA: Diagnosis not present

## 2019-09-20 DIAGNOSIS — G4733 Obstructive sleep apnea (adult) (pediatric): Secondary | ICD-10-CM | POA: Diagnosis not present

## 2019-09-20 DIAGNOSIS — R600 Localized edema: Secondary | ICD-10-CM | POA: Diagnosis not present

## 2019-09-20 DIAGNOSIS — I259 Chronic ischemic heart disease, unspecified: Secondary | ICD-10-CM | POA: Diagnosis not present

## 2019-09-20 DIAGNOSIS — I10 Essential (primary) hypertension: Secondary | ICD-10-CM | POA: Diagnosis not present

## 2019-09-21 DIAGNOSIS — I10 Essential (primary) hypertension: Secondary | ICD-10-CM | POA: Diagnosis not present

## 2019-09-21 DIAGNOSIS — E039 Hypothyroidism, unspecified: Secondary | ICD-10-CM | POA: Diagnosis not present

## 2019-09-26 DIAGNOSIS — R600 Localized edema: Secondary | ICD-10-CM | POA: Diagnosis not present

## 2019-09-26 DIAGNOSIS — I259 Chronic ischemic heart disease, unspecified: Secondary | ICD-10-CM | POA: Diagnosis not present

## 2019-09-26 DIAGNOSIS — R251 Tremor, unspecified: Secondary | ICD-10-CM | POA: Diagnosis not present

## 2019-09-26 DIAGNOSIS — I10 Essential (primary) hypertension: Secondary | ICD-10-CM | POA: Diagnosis not present

## 2019-09-26 DIAGNOSIS — G4733 Obstructive sleep apnea (adult) (pediatric): Secondary | ICD-10-CM | POA: Diagnosis not present

## 2019-09-30 DIAGNOSIS — I259 Chronic ischemic heart disease, unspecified: Secondary | ICD-10-CM | POA: Diagnosis not present

## 2019-09-30 DIAGNOSIS — Z79899 Other long term (current) drug therapy: Secondary | ICD-10-CM | POA: Diagnosis not present

## 2019-09-30 DIAGNOSIS — Z9989 Dependence on other enabling machines and devices: Secondary | ICD-10-CM | POA: Diagnosis not present

## 2019-09-30 DIAGNOSIS — G4733 Obstructive sleep apnea (adult) (pediatric): Secondary | ICD-10-CM | POA: Diagnosis not present

## 2019-09-30 DIAGNOSIS — Z90710 Acquired absence of both cervix and uterus: Secondary | ICD-10-CM | POA: Diagnosis not present

## 2019-09-30 DIAGNOSIS — Z791 Long term (current) use of non-steroidal anti-inflammatories (NSAID): Secondary | ICD-10-CM | POA: Diagnosis not present

## 2019-09-30 DIAGNOSIS — Z79811 Long term (current) use of aromatase inhibitors: Secondary | ICD-10-CM | POA: Diagnosis not present

## 2019-09-30 DIAGNOSIS — R600 Localized edema: Secondary | ICD-10-CM | POA: Diagnosis not present

## 2019-09-30 DIAGNOSIS — Z853 Personal history of malignant neoplasm of breast: Secondary | ICD-10-CM | POA: Diagnosis not present

## 2019-09-30 DIAGNOSIS — I10 Essential (primary) hypertension: Secondary | ICD-10-CM | POA: Diagnosis not present

## 2019-09-30 DIAGNOSIS — M25473 Effusion, unspecified ankle: Secondary | ICD-10-CM | POA: Diagnosis not present

## 2019-09-30 DIAGNOSIS — Z7982 Long term (current) use of aspirin: Secondary | ICD-10-CM | POA: Diagnosis not present

## 2019-09-30 DIAGNOSIS — Z6841 Body Mass Index (BMI) 40.0 and over, adult: Secondary | ICD-10-CM | POA: Diagnosis not present

## 2019-09-30 DIAGNOSIS — R251 Tremor, unspecified: Secondary | ICD-10-CM | POA: Diagnosis not present

## 2019-10-03 DIAGNOSIS — M6281 Muscle weakness (generalized): Secondary | ICD-10-CM | POA: Diagnosis not present

## 2019-10-03 DIAGNOSIS — N189 Chronic kidney disease, unspecified: Secondary | ICD-10-CM | POA: Diagnosis not present

## 2019-10-03 DIAGNOSIS — R2689 Other abnormalities of gait and mobility: Secondary | ICD-10-CM | POA: Diagnosis not present

## 2019-10-03 DIAGNOSIS — M4802 Spinal stenosis, cervical region: Secondary | ICD-10-CM | POA: Diagnosis not present

## 2019-10-04 DIAGNOSIS — R600 Localized edema: Secondary | ICD-10-CM | POA: Diagnosis not present

## 2019-10-04 DIAGNOSIS — I259 Chronic ischemic heart disease, unspecified: Secondary | ICD-10-CM | POA: Diagnosis not present

## 2019-10-05 DIAGNOSIS — R251 Tremor, unspecified: Secondary | ICD-10-CM | POA: Diagnosis not present

## 2019-10-05 DIAGNOSIS — I10 Essential (primary) hypertension: Secondary | ICD-10-CM | POA: Diagnosis not present

## 2019-10-05 DIAGNOSIS — I259 Chronic ischemic heart disease, unspecified: Secondary | ICD-10-CM | POA: Diagnosis not present

## 2019-10-05 DIAGNOSIS — G4733 Obstructive sleep apnea (adult) (pediatric): Secondary | ICD-10-CM | POA: Diagnosis not present

## 2019-10-05 DIAGNOSIS — R600 Localized edema: Secondary | ICD-10-CM | POA: Diagnosis not present

## 2019-10-07 DIAGNOSIS — G4733 Obstructive sleep apnea (adult) (pediatric): Secondary | ICD-10-CM | POA: Diagnosis not present

## 2019-10-07 DIAGNOSIS — R251 Tremor, unspecified: Secondary | ICD-10-CM | POA: Diagnosis not present

## 2019-10-07 DIAGNOSIS — I10 Essential (primary) hypertension: Secondary | ICD-10-CM | POA: Diagnosis not present

## 2019-10-07 DIAGNOSIS — R600 Localized edema: Secondary | ICD-10-CM | POA: Diagnosis not present

## 2019-10-07 DIAGNOSIS — I259 Chronic ischemic heart disease, unspecified: Secondary | ICD-10-CM | POA: Diagnosis not present

## 2019-10-11 DIAGNOSIS — I10 Essential (primary) hypertension: Secondary | ICD-10-CM | POA: Diagnosis not present

## 2019-10-11 DIAGNOSIS — Z6837 Body mass index (BMI) 37.0-37.9, adult: Secondary | ICD-10-CM | POA: Diagnosis not present

## 2019-10-11 DIAGNOSIS — M4722 Other spondylosis with radiculopathy, cervical region: Secondary | ICD-10-CM | POA: Diagnosis not present

## 2019-10-11 DIAGNOSIS — M4712 Other spondylosis with myelopathy, cervical region: Secondary | ICD-10-CM | POA: Diagnosis not present

## 2019-10-12 DIAGNOSIS — I259 Chronic ischemic heart disease, unspecified: Secondary | ICD-10-CM | POA: Diagnosis not present

## 2019-10-12 DIAGNOSIS — G4733 Obstructive sleep apnea (adult) (pediatric): Secondary | ICD-10-CM | POA: Diagnosis not present

## 2019-10-12 DIAGNOSIS — R600 Localized edema: Secondary | ICD-10-CM | POA: Diagnosis not present

## 2019-10-12 DIAGNOSIS — I10 Essential (primary) hypertension: Secondary | ICD-10-CM | POA: Diagnosis not present

## 2019-10-12 DIAGNOSIS — R251 Tremor, unspecified: Secondary | ICD-10-CM | POA: Diagnosis not present

## 2019-10-19 DIAGNOSIS — R600 Localized edema: Secondary | ICD-10-CM | POA: Diagnosis not present

## 2019-10-19 DIAGNOSIS — I10 Essential (primary) hypertension: Secondary | ICD-10-CM | POA: Diagnosis not present

## 2019-10-19 DIAGNOSIS — I259 Chronic ischemic heart disease, unspecified: Secondary | ICD-10-CM | POA: Diagnosis not present

## 2019-10-19 DIAGNOSIS — R251 Tremor, unspecified: Secondary | ICD-10-CM | POA: Diagnosis not present

## 2019-10-19 DIAGNOSIS — G4733 Obstructive sleep apnea (adult) (pediatric): Secondary | ICD-10-CM | POA: Diagnosis not present

## 2019-10-21 DIAGNOSIS — G4733 Obstructive sleep apnea (adult) (pediatric): Secondary | ICD-10-CM | POA: Diagnosis not present

## 2019-10-21 DIAGNOSIS — R251 Tremor, unspecified: Secondary | ICD-10-CM | POA: Diagnosis not present

## 2019-10-21 DIAGNOSIS — I259 Chronic ischemic heart disease, unspecified: Secondary | ICD-10-CM | POA: Diagnosis not present

## 2019-10-21 DIAGNOSIS — I10 Essential (primary) hypertension: Secondary | ICD-10-CM | POA: Diagnosis not present

## 2019-10-21 DIAGNOSIS — R600 Localized edema: Secondary | ICD-10-CM | POA: Diagnosis not present

## 2019-10-21 DIAGNOSIS — J219 Acute bronchiolitis, unspecified: Secondary | ICD-10-CM | POA: Diagnosis not present

## 2019-10-24 DIAGNOSIS — I259 Chronic ischemic heart disease, unspecified: Secondary | ICD-10-CM | POA: Diagnosis not present

## 2019-10-25 DIAGNOSIS — I10 Essential (primary) hypertension: Secondary | ICD-10-CM | POA: Diagnosis not present

## 2019-10-25 DIAGNOSIS — G4733 Obstructive sleep apnea (adult) (pediatric): Secondary | ICD-10-CM | POA: Diagnosis not present

## 2019-10-25 DIAGNOSIS — I259 Chronic ischemic heart disease, unspecified: Secondary | ICD-10-CM | POA: Diagnosis not present

## 2019-10-25 DIAGNOSIS — R251 Tremor, unspecified: Secondary | ICD-10-CM | POA: Diagnosis not present

## 2019-10-25 DIAGNOSIS — R600 Localized edema: Secondary | ICD-10-CM | POA: Diagnosis not present

## 2019-10-27 DIAGNOSIS — R600 Localized edema: Secondary | ICD-10-CM | POA: Diagnosis not present

## 2019-10-27 DIAGNOSIS — G4733 Obstructive sleep apnea (adult) (pediatric): Secondary | ICD-10-CM | POA: Diagnosis not present

## 2019-10-27 DIAGNOSIS — I259 Chronic ischemic heart disease, unspecified: Secondary | ICD-10-CM | POA: Diagnosis not present

## 2019-10-27 DIAGNOSIS — R251 Tremor, unspecified: Secondary | ICD-10-CM | POA: Diagnosis not present

## 2019-10-27 DIAGNOSIS — I10 Essential (primary) hypertension: Secondary | ICD-10-CM | POA: Diagnosis not present

## 2019-10-30 DIAGNOSIS — Z7982 Long term (current) use of aspirin: Secondary | ICD-10-CM | POA: Diagnosis not present

## 2019-10-30 DIAGNOSIS — Z853 Personal history of malignant neoplasm of breast: Secondary | ICD-10-CM | POA: Diagnosis not present

## 2019-10-30 DIAGNOSIS — Z9989 Dependence on other enabling machines and devices: Secondary | ICD-10-CM | POA: Diagnosis not present

## 2019-10-30 DIAGNOSIS — R251 Tremor, unspecified: Secondary | ICD-10-CM | POA: Diagnosis not present

## 2019-10-30 DIAGNOSIS — G4733 Obstructive sleep apnea (adult) (pediatric): Secondary | ICD-10-CM | POA: Diagnosis not present

## 2019-10-30 DIAGNOSIS — Z6841 Body Mass Index (BMI) 40.0 and over, adult: Secondary | ICD-10-CM | POA: Diagnosis not present

## 2019-10-30 DIAGNOSIS — I10 Essential (primary) hypertension: Secondary | ICD-10-CM | POA: Diagnosis not present

## 2019-10-30 DIAGNOSIS — M25473 Effusion, unspecified ankle: Secondary | ICD-10-CM | POA: Diagnosis not present

## 2019-10-30 DIAGNOSIS — Z79811 Long term (current) use of aromatase inhibitors: Secondary | ICD-10-CM | POA: Diagnosis not present

## 2019-10-30 DIAGNOSIS — R600 Localized edema: Secondary | ICD-10-CM | POA: Diagnosis not present

## 2019-10-30 DIAGNOSIS — Z79899 Other long term (current) drug therapy: Secondary | ICD-10-CM | POA: Diagnosis not present

## 2019-10-30 DIAGNOSIS — I259 Chronic ischemic heart disease, unspecified: Secondary | ICD-10-CM | POA: Diagnosis not present

## 2019-10-30 DIAGNOSIS — Z90710 Acquired absence of both cervix and uterus: Secondary | ICD-10-CM | POA: Diagnosis not present

## 2019-10-30 DIAGNOSIS — Z791 Long term (current) use of non-steroidal anti-inflammatories (NSAID): Secondary | ICD-10-CM | POA: Diagnosis not present

## 2019-10-31 DIAGNOSIS — I10 Essential (primary) hypertension: Secondary | ICD-10-CM | POA: Diagnosis not present

## 2019-10-31 DIAGNOSIS — I259 Chronic ischemic heart disease, unspecified: Secondary | ICD-10-CM | POA: Diagnosis not present

## 2019-10-31 DIAGNOSIS — G4733 Obstructive sleep apnea (adult) (pediatric): Secondary | ICD-10-CM | POA: Diagnosis not present

## 2019-10-31 DIAGNOSIS — R251 Tremor, unspecified: Secondary | ICD-10-CM | POA: Diagnosis not present

## 2019-10-31 DIAGNOSIS — R600 Localized edema: Secondary | ICD-10-CM | POA: Diagnosis not present

## 2019-11-07 DIAGNOSIS — R251 Tremor, unspecified: Secondary | ICD-10-CM | POA: Diagnosis not present

## 2019-11-07 DIAGNOSIS — I259 Chronic ischemic heart disease, unspecified: Secondary | ICD-10-CM | POA: Diagnosis not present

## 2019-11-07 DIAGNOSIS — I10 Essential (primary) hypertension: Secondary | ICD-10-CM | POA: Diagnosis not present

## 2019-11-07 DIAGNOSIS — R600 Localized edema: Secondary | ICD-10-CM | POA: Diagnosis not present

## 2019-11-07 DIAGNOSIS — G4733 Obstructive sleep apnea (adult) (pediatric): Secondary | ICD-10-CM | POA: Diagnosis not present

## 2019-11-08 DIAGNOSIS — I129 Hypertensive chronic kidney disease with stage 1 through stage 4 chronic kidney disease, or unspecified chronic kidney disease: Secondary | ICD-10-CM | POA: Diagnosis not present

## 2019-11-08 DIAGNOSIS — N179 Acute kidney failure, unspecified: Secondary | ICD-10-CM | POA: Diagnosis not present

## 2019-11-08 DIAGNOSIS — K219 Gastro-esophageal reflux disease without esophagitis: Secondary | ICD-10-CM | POA: Diagnosis not present

## 2019-11-08 DIAGNOSIS — M4802 Spinal stenosis, cervical region: Secondary | ICD-10-CM | POA: Diagnosis not present

## 2019-11-08 DIAGNOSIS — G992 Myelopathy in diseases classified elsewhere: Secondary | ICD-10-CM | POA: Diagnosis not present

## 2019-11-08 DIAGNOSIS — R600 Localized edema: Secondary | ICD-10-CM | POA: Diagnosis not present

## 2019-11-08 DIAGNOSIS — N184 Chronic kidney disease, stage 4 (severe): Secondary | ICD-10-CM | POA: Diagnosis not present

## 2019-11-08 DIAGNOSIS — C50911 Malignant neoplasm of unspecified site of right female breast: Secondary | ICD-10-CM | POA: Diagnosis not present

## 2019-11-08 DIAGNOSIS — G4733 Obstructive sleep apnea (adult) (pediatric): Secondary | ICD-10-CM | POA: Diagnosis not present

## 2019-11-10 ENCOUNTER — Other Ambulatory Visit: Payer: Self-pay | Admitting: Internal Medicine

## 2019-11-10 ENCOUNTER — Other Ambulatory Visit: Payer: Self-pay | Admitting: Nephrology

## 2019-11-10 DIAGNOSIS — N184 Chronic kidney disease, stage 4 (severe): Secondary | ICD-10-CM

## 2019-11-11 DIAGNOSIS — E875 Hyperkalemia: Secondary | ICD-10-CM | POA: Diagnosis not present

## 2019-11-14 DIAGNOSIS — I10 Essential (primary) hypertension: Secondary | ICD-10-CM | POA: Diagnosis not present

## 2019-11-14 DIAGNOSIS — G4733 Obstructive sleep apnea (adult) (pediatric): Secondary | ICD-10-CM | POA: Diagnosis not present

## 2019-11-14 DIAGNOSIS — R251 Tremor, unspecified: Secondary | ICD-10-CM | POA: Diagnosis not present

## 2019-11-14 DIAGNOSIS — R600 Localized edema: Secondary | ICD-10-CM | POA: Diagnosis not present

## 2019-11-14 DIAGNOSIS — I259 Chronic ischemic heart disease, unspecified: Secondary | ICD-10-CM | POA: Diagnosis not present

## 2019-11-15 DIAGNOSIS — N179 Acute kidney failure, unspecified: Secondary | ICD-10-CM | POA: Diagnosis not present

## 2019-11-15 DIAGNOSIS — N184 Chronic kidney disease, stage 4 (severe): Secondary | ICD-10-CM | POA: Diagnosis not present

## 2019-11-15 DIAGNOSIS — R600 Localized edema: Secondary | ICD-10-CM | POA: Diagnosis not present

## 2019-11-22 DIAGNOSIS — Z7982 Long term (current) use of aspirin: Secondary | ICD-10-CM | POA: Diagnosis not present

## 2019-11-22 DIAGNOSIS — Z79891 Long term (current) use of opiate analgesic: Secondary | ICD-10-CM | POA: Diagnosis not present

## 2019-11-22 DIAGNOSIS — R4182 Altered mental status, unspecified: Secondary | ICD-10-CM | POA: Diagnosis not present

## 2019-11-22 DIAGNOSIS — Z79899 Other long term (current) drug therapy: Secondary | ICD-10-CM | POA: Diagnosis not present

## 2019-11-22 DIAGNOSIS — N3001 Acute cystitis with hematuria: Secondary | ICD-10-CM | POA: Diagnosis not present

## 2019-11-23 DIAGNOSIS — G4733 Obstructive sleep apnea (adult) (pediatric): Secondary | ICD-10-CM | POA: Diagnosis not present

## 2019-11-23 DIAGNOSIS — I10 Essential (primary) hypertension: Secondary | ICD-10-CM | POA: Diagnosis not present

## 2019-11-23 DIAGNOSIS — R251 Tremor, unspecified: Secondary | ICD-10-CM | POA: Diagnosis not present

## 2019-11-23 DIAGNOSIS — I259 Chronic ischemic heart disease, unspecified: Secondary | ICD-10-CM | POA: Diagnosis not present

## 2019-11-23 DIAGNOSIS — R600 Localized edema: Secondary | ICD-10-CM | POA: Diagnosis not present

## 2019-11-24 ENCOUNTER — Other Ambulatory Visit: Payer: Medicare Other

## 2019-11-24 DIAGNOSIS — G4733 Obstructive sleep apnea (adult) (pediatric): Secondary | ICD-10-CM | POA: Diagnosis not present

## 2019-11-24 DIAGNOSIS — R251 Tremor, unspecified: Secondary | ICD-10-CM | POA: Diagnosis not present

## 2019-11-24 DIAGNOSIS — I10 Essential (primary) hypertension: Secondary | ICD-10-CM | POA: Diagnosis not present

## 2019-11-24 DIAGNOSIS — R600 Localized edema: Secondary | ICD-10-CM | POA: Diagnosis not present

## 2019-11-24 DIAGNOSIS — I259 Chronic ischemic heart disease, unspecified: Secondary | ICD-10-CM | POA: Diagnosis not present

## 2019-11-29 DIAGNOSIS — Z7982 Long term (current) use of aspirin: Secondary | ICD-10-CM | POA: Diagnosis not present

## 2019-11-29 DIAGNOSIS — L89321 Pressure ulcer of left buttock, stage 1: Secondary | ICD-10-CM | POA: Diagnosis not present

## 2019-11-29 DIAGNOSIS — R600 Localized edema: Secondary | ICD-10-CM | POA: Diagnosis not present

## 2019-11-29 DIAGNOSIS — Z90710 Acquired absence of both cervix and uterus: Secondary | ICD-10-CM | POA: Diagnosis not present

## 2019-11-29 DIAGNOSIS — L89322 Pressure ulcer of left buttock, stage 2: Secondary | ICD-10-CM | POA: Diagnosis not present

## 2019-11-29 DIAGNOSIS — Z791 Long term (current) use of non-steroidal anti-inflammatories (NSAID): Secondary | ICD-10-CM | POA: Diagnosis not present

## 2019-11-29 DIAGNOSIS — M25473 Effusion, unspecified ankle: Secondary | ICD-10-CM | POA: Diagnosis not present

## 2019-11-29 DIAGNOSIS — G4733 Obstructive sleep apnea (adult) (pediatric): Secondary | ICD-10-CM | POA: Diagnosis not present

## 2019-11-29 DIAGNOSIS — Z853 Personal history of malignant neoplasm of breast: Secondary | ICD-10-CM | POA: Diagnosis not present

## 2019-11-29 DIAGNOSIS — I10 Essential (primary) hypertension: Secondary | ICD-10-CM | POA: Diagnosis not present

## 2019-11-29 DIAGNOSIS — Z79899 Other long term (current) drug therapy: Secondary | ICD-10-CM | POA: Diagnosis not present

## 2019-11-29 DIAGNOSIS — Z79811 Long term (current) use of aromatase inhibitors: Secondary | ICD-10-CM | POA: Diagnosis not present

## 2019-11-29 DIAGNOSIS — Z6841 Body Mass Index (BMI) 40.0 and over, adult: Secondary | ICD-10-CM | POA: Diagnosis not present

## 2019-11-29 DIAGNOSIS — R251 Tremor, unspecified: Secondary | ICD-10-CM | POA: Diagnosis not present

## 2019-11-29 DIAGNOSIS — I259 Chronic ischemic heart disease, unspecified: Secondary | ICD-10-CM | POA: Diagnosis not present

## 2019-11-29 DIAGNOSIS — Z9989 Dependence on other enabling machines and devices: Secondary | ICD-10-CM | POA: Diagnosis not present

## 2019-11-30 DIAGNOSIS — I259 Chronic ischemic heart disease, unspecified: Secondary | ICD-10-CM | POA: Diagnosis not present

## 2019-11-30 DIAGNOSIS — G4733 Obstructive sleep apnea (adult) (pediatric): Secondary | ICD-10-CM | POA: Diagnosis not present

## 2019-11-30 DIAGNOSIS — L89321 Pressure ulcer of left buttock, stage 1: Secondary | ICD-10-CM | POA: Diagnosis not present

## 2019-11-30 DIAGNOSIS — L89322 Pressure ulcer of left buttock, stage 2: Secondary | ICD-10-CM | POA: Diagnosis not present

## 2019-11-30 DIAGNOSIS — R600 Localized edema: Secondary | ICD-10-CM | POA: Diagnosis not present

## 2019-12-01 ENCOUNTER — Other Ambulatory Visit: Payer: Medicare Other

## 2019-12-02 DIAGNOSIS — G4733 Obstructive sleep apnea (adult) (pediatric): Secondary | ICD-10-CM | POA: Diagnosis not present

## 2019-12-02 DIAGNOSIS — I259 Chronic ischemic heart disease, unspecified: Secondary | ICD-10-CM | POA: Diagnosis not present

## 2019-12-02 DIAGNOSIS — L89322 Pressure ulcer of left buttock, stage 2: Secondary | ICD-10-CM | POA: Diagnosis not present

## 2019-12-02 DIAGNOSIS — R600 Localized edema: Secondary | ICD-10-CM | POA: Diagnosis not present

## 2019-12-02 DIAGNOSIS — L89321 Pressure ulcer of left buttock, stage 1: Secondary | ICD-10-CM | POA: Diagnosis not present

## 2019-12-06 DIAGNOSIS — L89321 Pressure ulcer of left buttock, stage 1: Secondary | ICD-10-CM | POA: Diagnosis not present

## 2019-12-06 DIAGNOSIS — G4733 Obstructive sleep apnea (adult) (pediatric): Secondary | ICD-10-CM | POA: Diagnosis not present

## 2019-12-06 DIAGNOSIS — R600 Localized edema: Secondary | ICD-10-CM | POA: Diagnosis not present

## 2019-12-06 DIAGNOSIS — I259 Chronic ischemic heart disease, unspecified: Secondary | ICD-10-CM | POA: Diagnosis not present

## 2019-12-06 DIAGNOSIS — L89322 Pressure ulcer of left buttock, stage 2: Secondary | ICD-10-CM | POA: Diagnosis not present

## 2019-12-11 DIAGNOSIS — R531 Weakness: Secondary | ICD-10-CM | POA: Diagnosis not present

## 2019-12-11 DIAGNOSIS — N289 Disorder of kidney and ureter, unspecified: Secondary | ICD-10-CM | POA: Diagnosis not present

## 2019-12-11 DIAGNOSIS — K529 Noninfective gastroenteritis and colitis, unspecified: Secondary | ICD-10-CM | POA: Diagnosis not present

## 2019-12-11 DIAGNOSIS — E860 Dehydration: Secondary | ICD-10-CM | POA: Diagnosis not present

## 2019-12-12 DIAGNOSIS — K529 Noninfective gastroenteritis and colitis, unspecified: Secondary | ICD-10-CM

## 2019-12-12 DIAGNOSIS — E039 Hypothyroidism, unspecified: Secondary | ICD-10-CM

## 2019-12-12 DIAGNOSIS — N289 Disorder of kidney and ureter, unspecified: Secondary | ICD-10-CM

## 2019-12-12 DIAGNOSIS — K219 Gastro-esophageal reflux disease without esophagitis: Secondary | ICD-10-CM

## 2019-12-12 DIAGNOSIS — I361 Nonrheumatic tricuspid (valve) insufficiency: Secondary | ICD-10-CM | POA: Diagnosis not present

## 2019-12-12 DIAGNOSIS — E860 Dehydration: Secondary | ICD-10-CM | POA: Diagnosis not present

## 2019-12-12 DIAGNOSIS — I10 Essential (primary) hypertension: Secondary | ICD-10-CM | POA: Diagnosis not present

## 2019-12-12 DIAGNOSIS — R531 Weakness: Secondary | ICD-10-CM

## 2019-12-13 DIAGNOSIS — I10 Essential (primary) hypertension: Secondary | ICD-10-CM | POA: Diagnosis not present

## 2019-12-13 DIAGNOSIS — E039 Hypothyroidism, unspecified: Secondary | ICD-10-CM | POA: Diagnosis not present

## 2019-12-13 DIAGNOSIS — K219 Gastro-esophageal reflux disease without esophagitis: Secondary | ICD-10-CM | POA: Diagnosis not present

## 2019-12-13 DIAGNOSIS — E860 Dehydration: Secondary | ICD-10-CM | POA: Diagnosis not present

## 2019-12-14 DIAGNOSIS — I10 Essential (primary) hypertension: Secondary | ICD-10-CM | POA: Diagnosis not present

## 2019-12-14 DIAGNOSIS — K219 Gastro-esophageal reflux disease without esophagitis: Secondary | ICD-10-CM | POA: Diagnosis not present

## 2019-12-14 DIAGNOSIS — E039 Hypothyroidism, unspecified: Secondary | ICD-10-CM | POA: Diagnosis not present

## 2019-12-14 DIAGNOSIS — E860 Dehydration: Secondary | ICD-10-CM | POA: Diagnosis not present

## 2019-12-15 DIAGNOSIS — E860 Dehydration: Secondary | ICD-10-CM | POA: Diagnosis not present

## 2019-12-15 DIAGNOSIS — E039 Hypothyroidism, unspecified: Secondary | ICD-10-CM | POA: Diagnosis not present

## 2019-12-15 DIAGNOSIS — K219 Gastro-esophageal reflux disease without esophagitis: Secondary | ICD-10-CM | POA: Diagnosis not present

## 2019-12-15 DIAGNOSIS — I10 Essential (primary) hypertension: Secondary | ICD-10-CM | POA: Diagnosis not present

## 2019-12-16 DIAGNOSIS — K219 Gastro-esophageal reflux disease without esophagitis: Secondary | ICD-10-CM | POA: Diagnosis not present

## 2019-12-16 DIAGNOSIS — I959 Hypotension, unspecified: Secondary | ICD-10-CM | POA: Diagnosis not present

## 2019-12-16 DIAGNOSIS — I10 Essential (primary) hypertension: Secondary | ICD-10-CM | POA: Diagnosis not present

## 2019-12-16 DIAGNOSIS — Z7401 Bed confinement status: Secondary | ICD-10-CM | POA: Diagnosis not present

## 2019-12-16 DIAGNOSIS — E039 Hypothyroidism, unspecified: Secondary | ICD-10-CM | POA: Diagnosis not present

## 2019-12-16 DIAGNOSIS — E860 Dehydration: Secondary | ICD-10-CM | POA: Diagnosis not present

## 2019-12-17 DIAGNOSIS — L89322 Pressure ulcer of left buttock, stage 2: Secondary | ICD-10-CM | POA: Diagnosis not present

## 2019-12-17 DIAGNOSIS — G4733 Obstructive sleep apnea (adult) (pediatric): Secondary | ICD-10-CM | POA: Diagnosis not present

## 2019-12-17 DIAGNOSIS — R600 Localized edema: Secondary | ICD-10-CM | POA: Diagnosis not present

## 2019-12-17 DIAGNOSIS — L89321 Pressure ulcer of left buttock, stage 1: Secondary | ICD-10-CM | POA: Diagnosis not present

## 2019-12-17 DIAGNOSIS — I259 Chronic ischemic heart disease, unspecified: Secondary | ICD-10-CM | POA: Diagnosis not present

## 2019-12-18 DIAGNOSIS — L89321 Pressure ulcer of left buttock, stage 1: Secondary | ICD-10-CM | POA: Diagnosis not present

## 2019-12-18 DIAGNOSIS — L89322 Pressure ulcer of left buttock, stage 2: Secondary | ICD-10-CM | POA: Diagnosis not present

## 2019-12-18 DIAGNOSIS — I259 Chronic ischemic heart disease, unspecified: Secondary | ICD-10-CM | POA: Diagnosis not present

## 2019-12-18 DIAGNOSIS — G4733 Obstructive sleep apnea (adult) (pediatric): Secondary | ICD-10-CM | POA: Diagnosis not present

## 2019-12-18 DIAGNOSIS — R600 Localized edema: Secondary | ICD-10-CM | POA: Diagnosis not present

## 2019-12-20 DIAGNOSIS — R600 Localized edema: Secondary | ICD-10-CM | POA: Diagnosis not present

## 2019-12-20 DIAGNOSIS — I259 Chronic ischemic heart disease, unspecified: Secondary | ICD-10-CM | POA: Diagnosis not present

## 2019-12-20 DIAGNOSIS — L89322 Pressure ulcer of left buttock, stage 2: Secondary | ICD-10-CM | POA: Diagnosis not present

## 2019-12-20 DIAGNOSIS — L89321 Pressure ulcer of left buttock, stage 1: Secondary | ICD-10-CM | POA: Diagnosis not present

## 2019-12-20 DIAGNOSIS — G4733 Obstructive sleep apnea (adult) (pediatric): Secondary | ICD-10-CM | POA: Diagnosis not present

## 2019-12-22 DIAGNOSIS — L89322 Pressure ulcer of left buttock, stage 2: Secondary | ICD-10-CM | POA: Diagnosis not present

## 2019-12-22 DIAGNOSIS — R600 Localized edema: Secondary | ICD-10-CM | POA: Diagnosis not present

## 2019-12-22 DIAGNOSIS — G4733 Obstructive sleep apnea (adult) (pediatric): Secondary | ICD-10-CM | POA: Diagnosis not present

## 2019-12-22 DIAGNOSIS — L89321 Pressure ulcer of left buttock, stage 1: Secondary | ICD-10-CM | POA: Diagnosis not present

## 2019-12-22 DIAGNOSIS — I259 Chronic ischemic heart disease, unspecified: Secondary | ICD-10-CM | POA: Diagnosis not present

## 2019-12-28 DIAGNOSIS — R600 Localized edema: Secondary | ICD-10-CM | POA: Diagnosis not present

## 2019-12-28 DIAGNOSIS — G4733 Obstructive sleep apnea (adult) (pediatric): Secondary | ICD-10-CM | POA: Diagnosis not present

## 2019-12-28 DIAGNOSIS — L89321 Pressure ulcer of left buttock, stage 1: Secondary | ICD-10-CM | POA: Diagnosis not present

## 2019-12-28 DIAGNOSIS — I259 Chronic ischemic heart disease, unspecified: Secondary | ICD-10-CM | POA: Diagnosis not present

## 2019-12-28 DIAGNOSIS — L89322 Pressure ulcer of left buttock, stage 2: Secondary | ICD-10-CM | POA: Diagnosis not present

## 2019-12-29 DIAGNOSIS — G4733 Obstructive sleep apnea (adult) (pediatric): Secondary | ICD-10-CM | POA: Diagnosis not present

## 2019-12-29 DIAGNOSIS — Z853 Personal history of malignant neoplasm of breast: Secondary | ICD-10-CM | POA: Diagnosis not present

## 2019-12-29 DIAGNOSIS — S90521D Blister (nonthermal), right ankle, subsequent encounter: Secondary | ICD-10-CM | POA: Diagnosis not present

## 2019-12-29 DIAGNOSIS — I259 Chronic ischemic heart disease, unspecified: Secondary | ICD-10-CM | POA: Diagnosis not present

## 2019-12-29 DIAGNOSIS — I10 Essential (primary) hypertension: Secondary | ICD-10-CM | POA: Diagnosis not present

## 2019-12-29 DIAGNOSIS — S90821D Blister (nonthermal), right foot, subsequent encounter: Secondary | ICD-10-CM | POA: Diagnosis not present

## 2019-12-29 DIAGNOSIS — L89321 Pressure ulcer of left buttock, stage 1: Secondary | ICD-10-CM | POA: Diagnosis not present

## 2019-12-29 DIAGNOSIS — Z79899 Other long term (current) drug therapy: Secondary | ICD-10-CM | POA: Diagnosis not present

## 2019-12-29 DIAGNOSIS — Z6841 Body Mass Index (BMI) 40.0 and over, adult: Secondary | ICD-10-CM | POA: Diagnosis not present

## 2019-12-29 DIAGNOSIS — R251 Tremor, unspecified: Secondary | ICD-10-CM | POA: Diagnosis not present

## 2019-12-29 DIAGNOSIS — Z9989 Dependence on other enabling machines and devices: Secondary | ICD-10-CM | POA: Diagnosis not present

## 2019-12-29 DIAGNOSIS — Z90710 Acquired absence of both cervix and uterus: Secondary | ICD-10-CM | POA: Diagnosis not present

## 2019-12-29 DIAGNOSIS — R600 Localized edema: Secondary | ICD-10-CM | POA: Diagnosis not present

## 2019-12-29 DIAGNOSIS — L89322 Pressure ulcer of left buttock, stage 2: Secondary | ICD-10-CM | POA: Diagnosis not present

## 2019-12-29 DIAGNOSIS — Z791 Long term (current) use of non-steroidal anti-inflammatories (NSAID): Secondary | ICD-10-CM | POA: Diagnosis not present

## 2019-12-29 DIAGNOSIS — S90822D Blister (nonthermal), left foot, subsequent encounter: Secondary | ICD-10-CM | POA: Diagnosis not present

## 2019-12-29 DIAGNOSIS — Z7982 Long term (current) use of aspirin: Secondary | ICD-10-CM | POA: Diagnosis not present

## 2019-12-29 DIAGNOSIS — M25473 Effusion, unspecified ankle: Secondary | ICD-10-CM | POA: Diagnosis not present

## 2019-12-29 DIAGNOSIS — Z79811 Long term (current) use of aromatase inhibitors: Secondary | ICD-10-CM | POA: Diagnosis not present

## 2019-12-30 DIAGNOSIS — R251 Tremor, unspecified: Secondary | ICD-10-CM | POA: Diagnosis not present

## 2019-12-30 DIAGNOSIS — I10 Essential (primary) hypertension: Secondary | ICD-10-CM | POA: Diagnosis not present

## 2019-12-30 DIAGNOSIS — G4733 Obstructive sleep apnea (adult) (pediatric): Secondary | ICD-10-CM | POA: Diagnosis not present

## 2019-12-30 DIAGNOSIS — I259 Chronic ischemic heart disease, unspecified: Secondary | ICD-10-CM | POA: Diagnosis not present

## 2019-12-30 DIAGNOSIS — R600 Localized edema: Secondary | ICD-10-CM | POA: Diagnosis not present

## 2020-01-03 DIAGNOSIS — I259 Chronic ischemic heart disease, unspecified: Secondary | ICD-10-CM | POA: Diagnosis not present

## 2020-01-03 DIAGNOSIS — R600 Localized edema: Secondary | ICD-10-CM | POA: Diagnosis not present

## 2020-01-03 DIAGNOSIS — I10 Essential (primary) hypertension: Secondary | ICD-10-CM | POA: Diagnosis not present

## 2020-01-03 DIAGNOSIS — R251 Tremor, unspecified: Secondary | ICD-10-CM | POA: Diagnosis not present

## 2020-01-03 DIAGNOSIS — G4733 Obstructive sleep apnea (adult) (pediatric): Secondary | ICD-10-CM | POA: Diagnosis not present

## 2020-01-04 ENCOUNTER — Other Ambulatory Visit: Payer: Medicare Other

## 2020-01-04 DIAGNOSIS — G4733 Obstructive sleep apnea (adult) (pediatric): Secondary | ICD-10-CM | POA: Diagnosis not present

## 2020-01-04 DIAGNOSIS — R600 Localized edema: Secondary | ICD-10-CM | POA: Diagnosis not present

## 2020-01-04 DIAGNOSIS — I259 Chronic ischemic heart disease, unspecified: Secondary | ICD-10-CM | POA: Diagnosis not present

## 2020-01-04 DIAGNOSIS — R251 Tremor, unspecified: Secondary | ICD-10-CM | POA: Diagnosis not present

## 2020-01-04 DIAGNOSIS — I10 Essential (primary) hypertension: Secondary | ICD-10-CM | POA: Diagnosis not present

## 2020-01-06 DIAGNOSIS — I259 Chronic ischemic heart disease, unspecified: Secondary | ICD-10-CM | POA: Diagnosis not present

## 2020-01-06 DIAGNOSIS — I10 Essential (primary) hypertension: Secondary | ICD-10-CM | POA: Diagnosis not present

## 2020-01-06 DIAGNOSIS — G4733 Obstructive sleep apnea (adult) (pediatric): Secondary | ICD-10-CM | POA: Diagnosis not present

## 2020-01-06 DIAGNOSIS — R251 Tremor, unspecified: Secondary | ICD-10-CM | POA: Diagnosis not present

## 2020-01-06 DIAGNOSIS — R600 Localized edema: Secondary | ICD-10-CM | POA: Diagnosis not present

## 2020-01-10 ENCOUNTER — Ambulatory Visit
Admission: RE | Admit: 2020-01-10 | Discharge: 2020-01-10 | Disposition: A | Discharge status: Home / Self Care | Payer: Medicare Other | Source: Ambulatory Visit | Attending: Nephrology | Admitting: Nephrology

## 2020-01-10 ENCOUNTER — Other Ambulatory Visit: Payer: Self-pay

## 2020-01-10 DIAGNOSIS — N184 Chronic kidney disease, stage 4 (severe): Secondary | ICD-10-CM

## 2020-01-10 IMAGING — US US RENAL
1 series · 14 of 25 positions shown · non-contrast
Comparison: Prior ultrasound from [DATE].

CLINICAL DATA: Initial evaluation for chronic kidney disease, stage
IV.

EXAM:
RENAL / URINARY TRACT ULTRASOUND COMPLETE

[Series 1: us renal · 0.19mm/px · 14 of 36 slices shown]
[im 1/36]
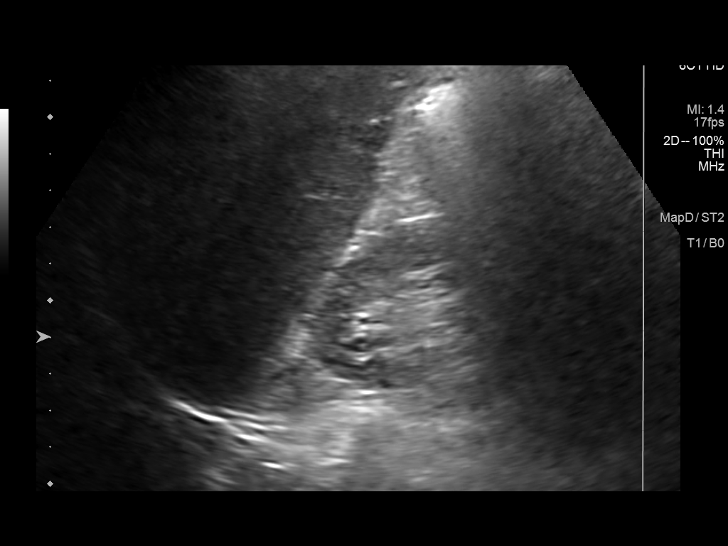
[im 3/36]
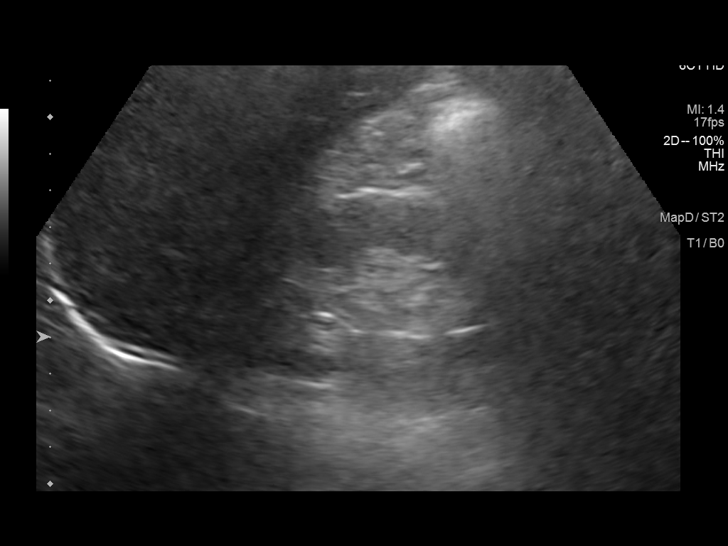
[im 6/36]
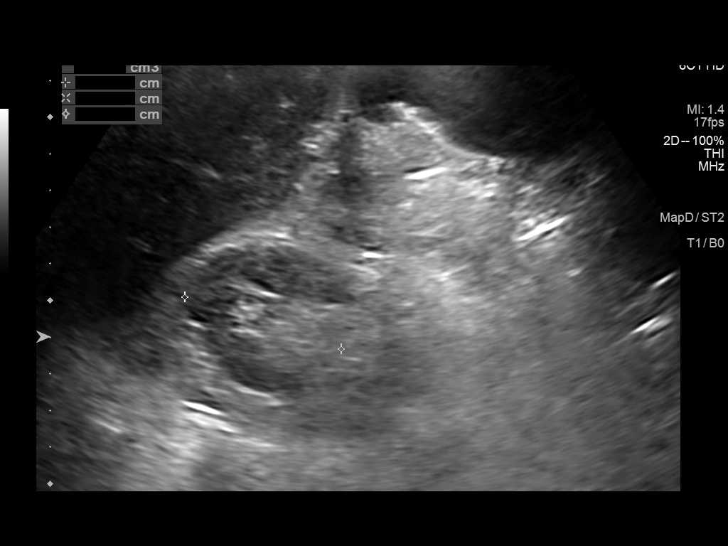
[im 9/36]
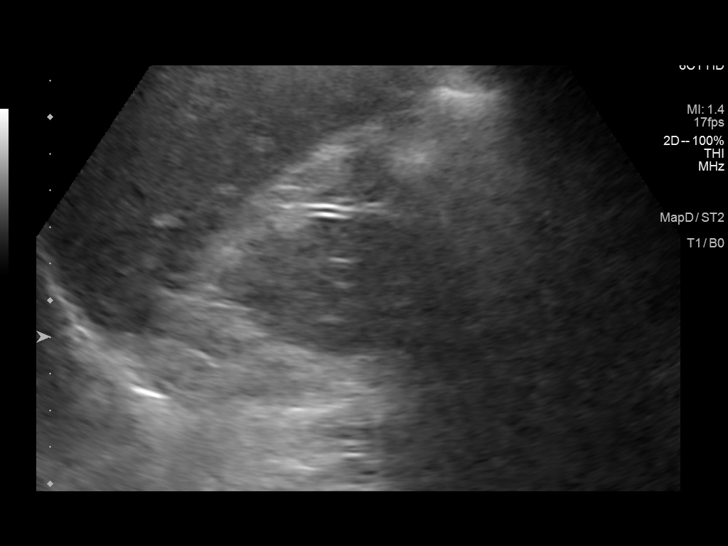
[im 12/36]
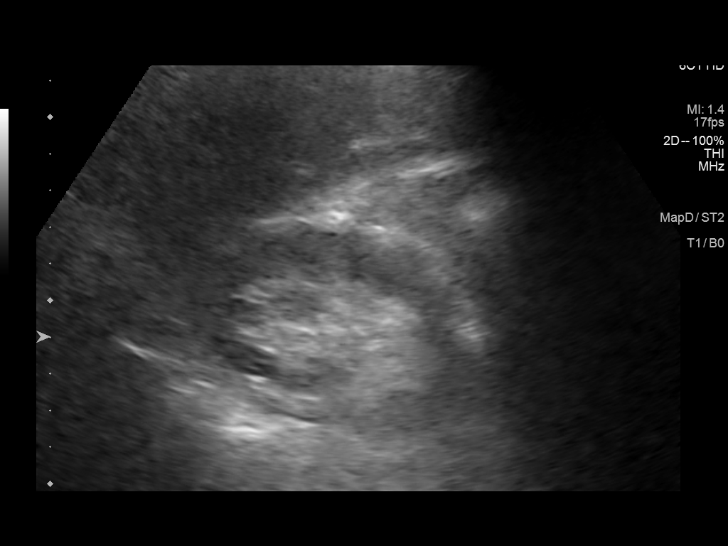
[im 14/36]
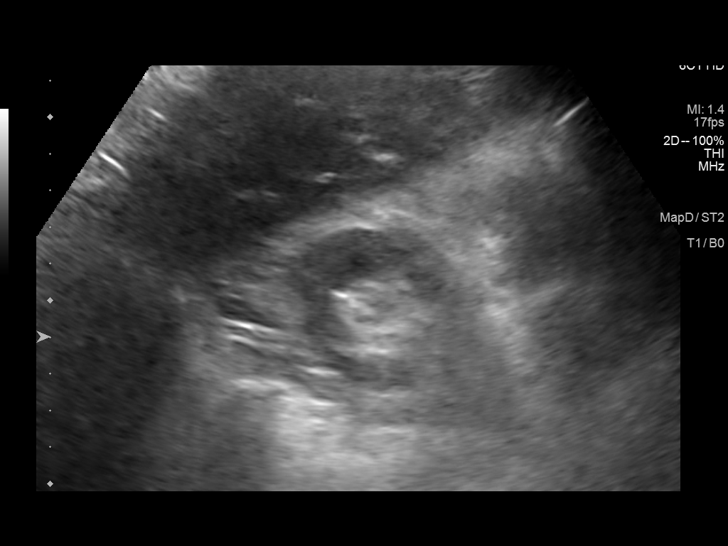
[im 17/36]
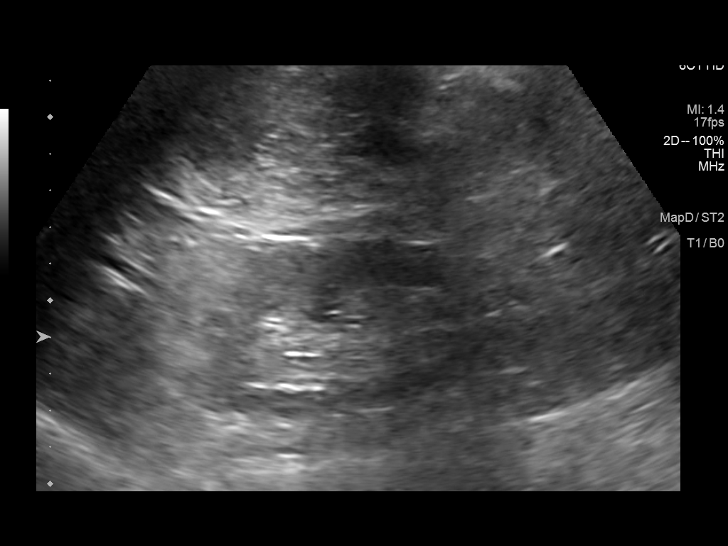
[im 19/36]
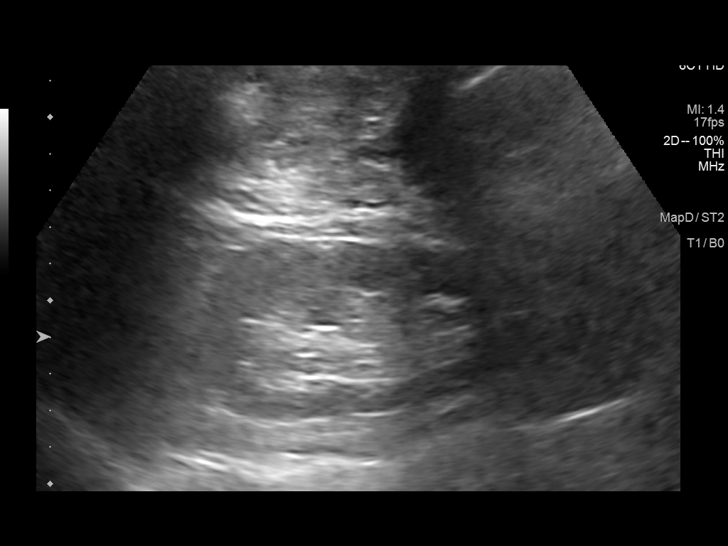
[im 22/36]
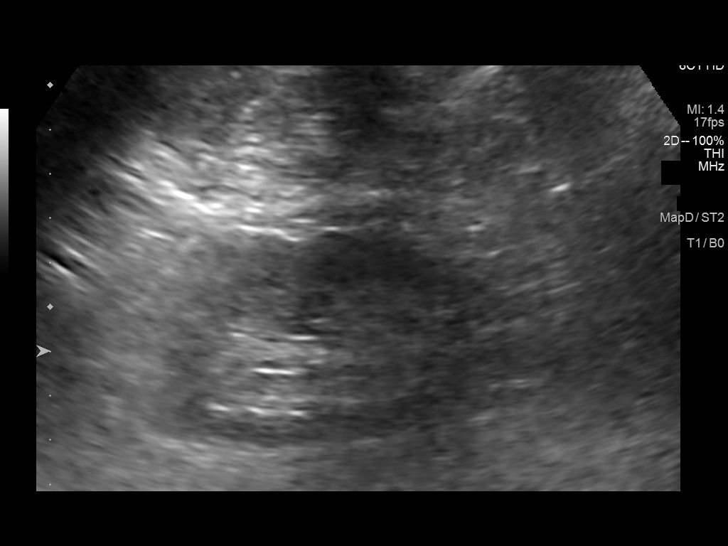
[im 24/36]
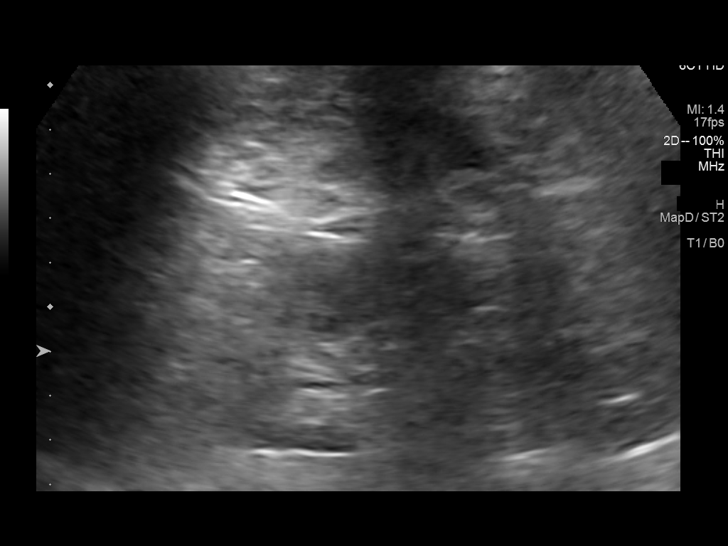
[im 27/36]
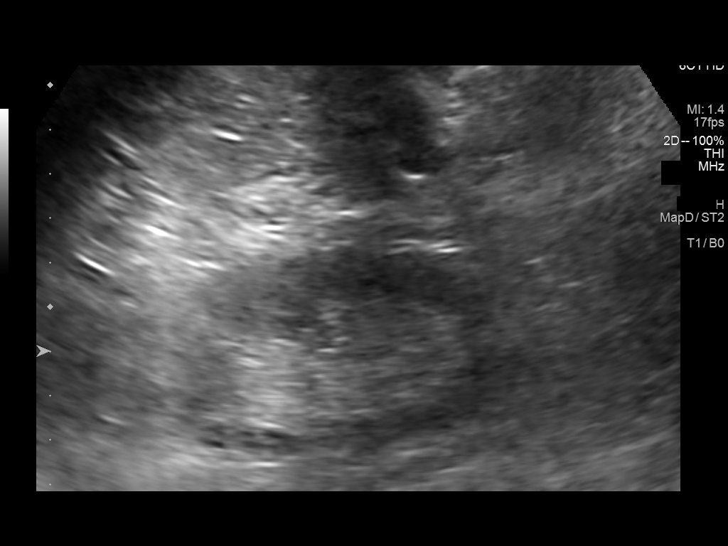
[im 30/36]
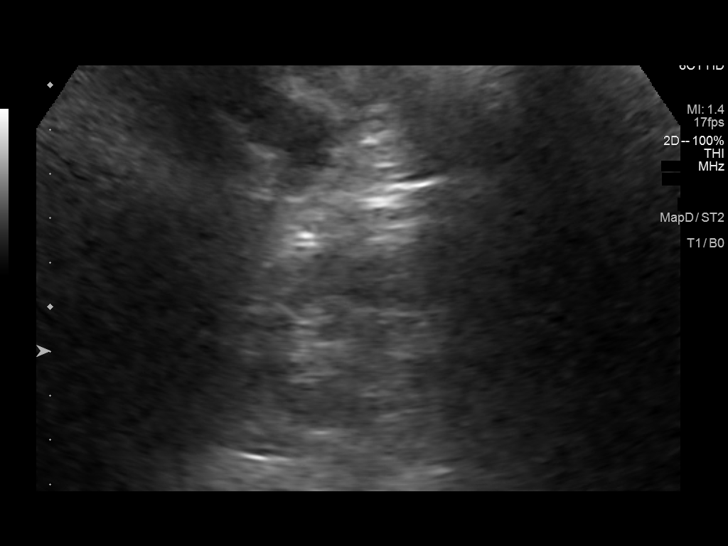
[im 33/36]
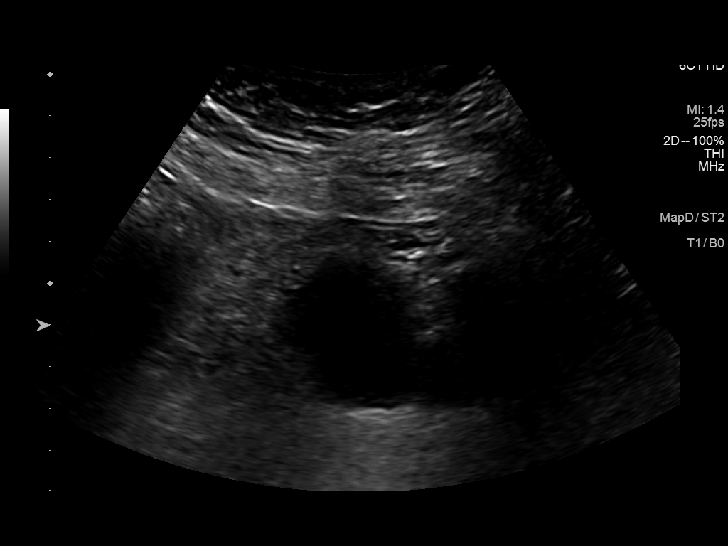
[im 36/36]
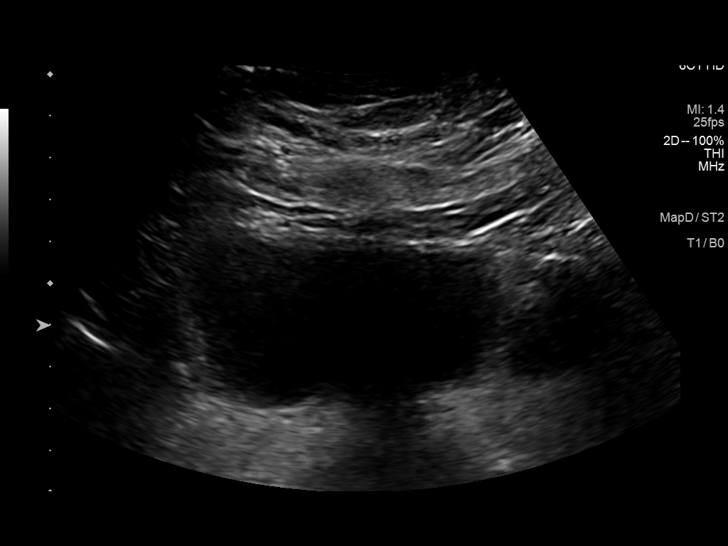

[14 of 25 positions shown; findings below may reference images not displayed]

FINDINGS: Right Kidney:

Renal measurements: 7.5 x 4.2 x 4.5 cm = volume: 73.5 mL. Diffusely
increased echogenicity seen within the renal parenchyma. No
nephrolithiasis or hydronephrosis. No focal renal mass.

Left Kidney:

Renal measurements: 9.0 x 4.7 x 4.6 cm = volume: 103.2 mL. Diffusely
increased echogenicity seen within the renal parenchyma. No
nephrolithiasis or hydronephrosis. No focal renal mass.

Bladder:

Appears normal for degree of bladder distention.

Other:

None.
IMPRESSION: 1. Diffusely increased echogenicity throughout the renal parenchyma,
consistent with medical renal disease.
2. No hydronephrosis or other significant finding.

## 2020-01-11 DIAGNOSIS — R251 Tremor, unspecified: Secondary | ICD-10-CM | POA: Diagnosis not present

## 2020-01-11 DIAGNOSIS — G4733 Obstructive sleep apnea (adult) (pediatric): Secondary | ICD-10-CM | POA: Diagnosis not present

## 2020-01-11 DIAGNOSIS — R600 Localized edema: Secondary | ICD-10-CM | POA: Diagnosis not present

## 2020-01-11 DIAGNOSIS — I259 Chronic ischemic heart disease, unspecified: Secondary | ICD-10-CM | POA: Diagnosis not present

## 2020-01-11 DIAGNOSIS — I10 Essential (primary) hypertension: Secondary | ICD-10-CM | POA: Diagnosis not present

## 2020-01-12 DIAGNOSIS — I259 Chronic ischemic heart disease, unspecified: Secondary | ICD-10-CM | POA: Diagnosis not present

## 2020-01-12 DIAGNOSIS — I10 Essential (primary) hypertension: Secondary | ICD-10-CM | POA: Diagnosis not present

## 2020-01-12 DIAGNOSIS — G4733 Obstructive sleep apnea (adult) (pediatric): Secondary | ICD-10-CM | POA: Diagnosis not present

## 2020-01-12 DIAGNOSIS — R600 Localized edema: Secondary | ICD-10-CM | POA: Diagnosis not present

## 2020-01-12 DIAGNOSIS — R251 Tremor, unspecified: Secondary | ICD-10-CM | POA: Diagnosis not present

## 2020-01-13 DIAGNOSIS — R251 Tremor, unspecified: Secondary | ICD-10-CM | POA: Diagnosis not present

## 2020-01-13 DIAGNOSIS — I10 Essential (primary) hypertension: Secondary | ICD-10-CM | POA: Diagnosis not present

## 2020-01-13 DIAGNOSIS — G4733 Obstructive sleep apnea (adult) (pediatric): Secondary | ICD-10-CM | POA: Diagnosis not present

## 2020-01-13 DIAGNOSIS — I259 Chronic ischemic heart disease, unspecified: Secondary | ICD-10-CM | POA: Diagnosis not present

## 2020-01-13 DIAGNOSIS — R600 Localized edema: Secondary | ICD-10-CM | POA: Diagnosis not present

## 2020-01-16 DIAGNOSIS — I10 Essential (primary) hypertension: Secondary | ICD-10-CM | POA: Diagnosis not present

## 2020-01-16 DIAGNOSIS — R600 Localized edema: Secondary | ICD-10-CM | POA: Diagnosis not present

## 2020-01-16 DIAGNOSIS — I259 Chronic ischemic heart disease, unspecified: Secondary | ICD-10-CM | POA: Diagnosis not present

## 2020-01-16 DIAGNOSIS — R251 Tremor, unspecified: Secondary | ICD-10-CM | POA: Diagnosis not present

## 2020-01-16 DIAGNOSIS — G4733 Obstructive sleep apnea (adult) (pediatric): Secondary | ICD-10-CM | POA: Diagnosis not present

## 2020-01-17 DIAGNOSIS — I10 Essential (primary) hypertension: Secondary | ICD-10-CM | POA: Diagnosis not present

## 2020-01-17 DIAGNOSIS — R251 Tremor, unspecified: Secondary | ICD-10-CM | POA: Diagnosis not present

## 2020-01-17 DIAGNOSIS — R600 Localized edema: Secondary | ICD-10-CM | POA: Diagnosis not present

## 2020-01-17 DIAGNOSIS — G4733 Obstructive sleep apnea (adult) (pediatric): Secondary | ICD-10-CM | POA: Diagnosis not present

## 2020-01-17 DIAGNOSIS — I259 Chronic ischemic heart disease, unspecified: Secondary | ICD-10-CM | POA: Diagnosis not present

## 2020-01-18 DIAGNOSIS — R600 Localized edema: Secondary | ICD-10-CM | POA: Diagnosis not present

## 2020-01-18 DIAGNOSIS — R251 Tremor, unspecified: Secondary | ICD-10-CM | POA: Diagnosis not present

## 2020-01-18 DIAGNOSIS — I259 Chronic ischemic heart disease, unspecified: Secondary | ICD-10-CM | POA: Diagnosis not present

## 2020-01-18 DIAGNOSIS — G4733 Obstructive sleep apnea (adult) (pediatric): Secondary | ICD-10-CM | POA: Diagnosis not present

## 2020-01-18 DIAGNOSIS — I10 Essential (primary) hypertension: Secondary | ICD-10-CM | POA: Diagnosis not present

## 2020-01-20 DIAGNOSIS — I10 Essential (primary) hypertension: Secondary | ICD-10-CM | POA: Diagnosis not present

## 2020-01-20 DIAGNOSIS — G4733 Obstructive sleep apnea (adult) (pediatric): Secondary | ICD-10-CM | POA: Diagnosis not present

## 2020-01-20 DIAGNOSIS — R600 Localized edema: Secondary | ICD-10-CM | POA: Diagnosis not present

## 2020-01-20 DIAGNOSIS — R251 Tremor, unspecified: Secondary | ICD-10-CM | POA: Diagnosis not present

## 2020-01-20 DIAGNOSIS — I259 Chronic ischemic heart disease, unspecified: Secondary | ICD-10-CM | POA: Diagnosis not present

## 2020-01-21 DIAGNOSIS — K219 Gastro-esophageal reflux disease without esophagitis: Secondary | ICD-10-CM | POA: Diagnosis not present

## 2020-01-21 DIAGNOSIS — E039 Hypothyroidism, unspecified: Secondary | ICD-10-CM | POA: Diagnosis not present

## 2020-01-21 DIAGNOSIS — I10 Essential (primary) hypertension: Secondary | ICD-10-CM | POA: Diagnosis not present

## 2020-01-21 DIAGNOSIS — M1990 Unspecified osteoarthritis, unspecified site: Secondary | ICD-10-CM | POA: Diagnosis not present

## 2020-01-23 DIAGNOSIS — I10 Essential (primary) hypertension: Secondary | ICD-10-CM | POA: Diagnosis not present

## 2020-01-23 DIAGNOSIS — I259 Chronic ischemic heart disease, unspecified: Secondary | ICD-10-CM | POA: Diagnosis not present

## 2020-01-23 DIAGNOSIS — G4733 Obstructive sleep apnea (adult) (pediatric): Secondary | ICD-10-CM | POA: Diagnosis not present

## 2020-01-23 DIAGNOSIS — R600 Localized edema: Secondary | ICD-10-CM | POA: Diagnosis not present

## 2020-01-23 DIAGNOSIS — R251 Tremor, unspecified: Secondary | ICD-10-CM | POA: Diagnosis not present

## 2020-01-24 DIAGNOSIS — M4802 Spinal stenosis, cervical region: Secondary | ICD-10-CM | POA: Diagnosis not present

## 2020-01-24 DIAGNOSIS — M6281 Muscle weakness (generalized): Secondary | ICD-10-CM | POA: Diagnosis not present

## 2020-01-24 DIAGNOSIS — N189 Chronic kidney disease, unspecified: Secondary | ICD-10-CM | POA: Diagnosis not present

## 2020-01-24 DIAGNOSIS — R2689 Other abnormalities of gait and mobility: Secondary | ICD-10-CM | POA: Diagnosis not present

## 2020-01-25 DIAGNOSIS — I10 Essential (primary) hypertension: Secondary | ICD-10-CM | POA: Diagnosis not present

## 2020-01-25 DIAGNOSIS — G4733 Obstructive sleep apnea (adult) (pediatric): Secondary | ICD-10-CM | POA: Diagnosis not present

## 2020-01-25 DIAGNOSIS — I259 Chronic ischemic heart disease, unspecified: Secondary | ICD-10-CM | POA: Diagnosis not present

## 2020-01-25 DIAGNOSIS — R600 Localized edema: Secondary | ICD-10-CM | POA: Diagnosis not present

## 2020-01-25 DIAGNOSIS — R251 Tremor, unspecified: Secondary | ICD-10-CM | POA: Diagnosis not present

## 2020-01-26 DIAGNOSIS — I259 Chronic ischemic heart disease, unspecified: Secondary | ICD-10-CM | POA: Diagnosis not present

## 2020-01-26 DIAGNOSIS — I10 Essential (primary) hypertension: Secondary | ICD-10-CM | POA: Diagnosis not present

## 2020-01-26 DIAGNOSIS — G4733 Obstructive sleep apnea (adult) (pediatric): Secondary | ICD-10-CM | POA: Diagnosis not present

## 2020-01-26 DIAGNOSIS — R251 Tremor, unspecified: Secondary | ICD-10-CM | POA: Diagnosis not present

## 2020-01-26 DIAGNOSIS — R600 Localized edema: Secondary | ICD-10-CM | POA: Diagnosis not present

## 2020-01-27 DIAGNOSIS — R251 Tremor, unspecified: Secondary | ICD-10-CM | POA: Diagnosis not present

## 2020-01-27 DIAGNOSIS — G4733 Obstructive sleep apnea (adult) (pediatric): Secondary | ICD-10-CM | POA: Diagnosis not present

## 2020-01-27 DIAGNOSIS — I259 Chronic ischemic heart disease, unspecified: Secondary | ICD-10-CM | POA: Diagnosis not present

## 2020-01-27 DIAGNOSIS — R600 Localized edema: Secondary | ICD-10-CM | POA: Diagnosis not present

## 2020-01-27 DIAGNOSIS — I10 Essential (primary) hypertension: Secondary | ICD-10-CM | POA: Diagnosis not present

## 2020-01-28 DIAGNOSIS — R600 Localized edema: Secondary | ICD-10-CM | POA: Diagnosis not present

## 2020-01-28 DIAGNOSIS — Z79899 Other long term (current) drug therapy: Secondary | ICD-10-CM | POA: Diagnosis not present

## 2020-01-28 DIAGNOSIS — I259 Chronic ischemic heart disease, unspecified: Secondary | ICD-10-CM | POA: Diagnosis not present

## 2020-01-28 DIAGNOSIS — Z7982 Long term (current) use of aspirin: Secondary | ICD-10-CM | POA: Diagnosis not present

## 2020-01-28 DIAGNOSIS — G4733 Obstructive sleep apnea (adult) (pediatric): Secondary | ICD-10-CM | POA: Diagnosis not present

## 2020-01-28 DIAGNOSIS — M25473 Effusion, unspecified ankle: Secondary | ICD-10-CM | POA: Diagnosis not present

## 2020-01-28 DIAGNOSIS — Z853 Personal history of malignant neoplasm of breast: Secondary | ICD-10-CM | POA: Diagnosis not present

## 2020-01-28 DIAGNOSIS — Z79811 Long term (current) use of aromatase inhibitors: Secondary | ICD-10-CM | POA: Diagnosis not present

## 2020-01-28 DIAGNOSIS — Z6841 Body Mass Index (BMI) 40.0 and over, adult: Secondary | ICD-10-CM | POA: Diagnosis not present

## 2020-01-28 DIAGNOSIS — I10 Essential (primary) hypertension: Secondary | ICD-10-CM | POA: Diagnosis not present

## 2020-01-28 DIAGNOSIS — Z791 Long term (current) use of non-steroidal anti-inflammatories (NSAID): Secondary | ICD-10-CM | POA: Diagnosis not present

## 2020-01-28 DIAGNOSIS — R251 Tremor, unspecified: Secondary | ICD-10-CM | POA: Diagnosis not present

## 2020-01-28 DIAGNOSIS — Z9989 Dependence on other enabling machines and devices: Secondary | ICD-10-CM | POA: Diagnosis not present

## 2020-01-28 DIAGNOSIS — Z90710 Acquired absence of both cervix and uterus: Secondary | ICD-10-CM | POA: Diagnosis not present

## 2020-01-28 DIAGNOSIS — L89612 Pressure ulcer of right heel, stage 2: Secondary | ICD-10-CM | POA: Diagnosis not present

## 2020-01-30 DIAGNOSIS — M1712 Unilateral primary osteoarthritis, left knee: Secondary | ICD-10-CM | POA: Diagnosis not present

## 2020-01-30 DIAGNOSIS — M25562 Pain in left knee: Secondary | ICD-10-CM | POA: Diagnosis not present

## 2020-01-31 DIAGNOSIS — R600 Localized edema: Secondary | ICD-10-CM | POA: Diagnosis not present

## 2020-01-31 DIAGNOSIS — L89612 Pressure ulcer of right heel, stage 2: Secondary | ICD-10-CM | POA: Diagnosis not present

## 2020-01-31 DIAGNOSIS — I259 Chronic ischemic heart disease, unspecified: Secondary | ICD-10-CM | POA: Diagnosis not present

## 2020-01-31 DIAGNOSIS — G4733 Obstructive sleep apnea (adult) (pediatric): Secondary | ICD-10-CM | POA: Diagnosis not present

## 2020-01-31 DIAGNOSIS — I10 Essential (primary) hypertension: Secondary | ICD-10-CM | POA: Diagnosis not present

## 2020-02-01 DIAGNOSIS — I10 Essential (primary) hypertension: Secondary | ICD-10-CM | POA: Diagnosis not present

## 2020-02-01 DIAGNOSIS — I259 Chronic ischemic heart disease, unspecified: Secondary | ICD-10-CM | POA: Diagnosis not present

## 2020-02-01 DIAGNOSIS — R600 Localized edema: Secondary | ICD-10-CM | POA: Diagnosis not present

## 2020-02-01 DIAGNOSIS — L89612 Pressure ulcer of right heel, stage 2: Secondary | ICD-10-CM | POA: Diagnosis not present

## 2020-02-01 DIAGNOSIS — G4733 Obstructive sleep apnea (adult) (pediatric): Secondary | ICD-10-CM | POA: Diagnosis not present

## 2020-02-03 DIAGNOSIS — G4733 Obstructive sleep apnea (adult) (pediatric): Secondary | ICD-10-CM | POA: Diagnosis not present

## 2020-02-03 DIAGNOSIS — L89612 Pressure ulcer of right heel, stage 2: Secondary | ICD-10-CM | POA: Diagnosis not present

## 2020-02-03 DIAGNOSIS — I10 Essential (primary) hypertension: Secondary | ICD-10-CM | POA: Diagnosis not present

## 2020-02-03 DIAGNOSIS — I259 Chronic ischemic heart disease, unspecified: Secondary | ICD-10-CM | POA: Diagnosis not present

## 2020-02-03 DIAGNOSIS — R600 Localized edema: Secondary | ICD-10-CM | POA: Diagnosis not present

## 2020-02-06 DIAGNOSIS — R600 Localized edema: Secondary | ICD-10-CM | POA: Diagnosis not present

## 2020-02-06 DIAGNOSIS — L89612 Pressure ulcer of right heel, stage 2: Secondary | ICD-10-CM | POA: Diagnosis not present

## 2020-02-06 DIAGNOSIS — G4733 Obstructive sleep apnea (adult) (pediatric): Secondary | ICD-10-CM | POA: Diagnosis not present

## 2020-02-06 DIAGNOSIS — I10 Essential (primary) hypertension: Secondary | ICD-10-CM | POA: Diagnosis not present

## 2020-02-06 DIAGNOSIS — I259 Chronic ischemic heart disease, unspecified: Secondary | ICD-10-CM | POA: Diagnosis not present

## 2020-02-07 DIAGNOSIS — G4733 Obstructive sleep apnea (adult) (pediatric): Secondary | ICD-10-CM | POA: Diagnosis not present

## 2020-02-07 DIAGNOSIS — K219 Gastro-esophageal reflux disease without esophagitis: Secondary | ICD-10-CM | POA: Diagnosis not present

## 2020-02-07 DIAGNOSIS — N179 Acute kidney failure, unspecified: Secondary | ICD-10-CM | POA: Diagnosis not present

## 2020-02-07 DIAGNOSIS — N1832 Chronic kidney disease, stage 3b: Secondary | ICD-10-CM | POA: Diagnosis not present

## 2020-02-07 DIAGNOSIS — R600 Localized edema: Secondary | ICD-10-CM | POA: Diagnosis not present

## 2020-02-07 DIAGNOSIS — C50911 Malignant neoplasm of unspecified site of right female breast: Secondary | ICD-10-CM | POA: Diagnosis not present

## 2020-02-07 DIAGNOSIS — G992 Myelopathy in diseases classified elsewhere: Secondary | ICD-10-CM | POA: Diagnosis not present

## 2020-02-07 DIAGNOSIS — I129 Hypertensive chronic kidney disease with stage 1 through stage 4 chronic kidney disease, or unspecified chronic kidney disease: Secondary | ICD-10-CM | POA: Diagnosis not present

## 2020-02-07 DIAGNOSIS — M4802 Spinal stenosis, cervical region: Secondary | ICD-10-CM | POA: Diagnosis not present

## 2020-02-07 DIAGNOSIS — I5032 Chronic diastolic (congestive) heart failure: Secondary | ICD-10-CM | POA: Diagnosis not present

## 2020-02-08 DIAGNOSIS — I10 Essential (primary) hypertension: Secondary | ICD-10-CM | POA: Diagnosis not present

## 2020-02-08 DIAGNOSIS — R600 Localized edema: Secondary | ICD-10-CM | POA: Diagnosis not present

## 2020-02-08 DIAGNOSIS — I259 Chronic ischemic heart disease, unspecified: Secondary | ICD-10-CM | POA: Diagnosis not present

## 2020-02-08 DIAGNOSIS — L89612 Pressure ulcer of right heel, stage 2: Secondary | ICD-10-CM | POA: Diagnosis not present

## 2020-02-08 DIAGNOSIS — G4733 Obstructive sleep apnea (adult) (pediatric): Secondary | ICD-10-CM | POA: Diagnosis not present

## 2020-02-09 DIAGNOSIS — R600 Localized edema: Secondary | ICD-10-CM | POA: Diagnosis not present

## 2020-02-09 DIAGNOSIS — L89612 Pressure ulcer of right heel, stage 2: Secondary | ICD-10-CM | POA: Diagnosis not present

## 2020-02-09 DIAGNOSIS — I259 Chronic ischemic heart disease, unspecified: Secondary | ICD-10-CM | POA: Diagnosis not present

## 2020-02-09 DIAGNOSIS — G4733 Obstructive sleep apnea (adult) (pediatric): Secondary | ICD-10-CM | POA: Diagnosis not present

## 2020-02-09 DIAGNOSIS — N179 Acute kidney failure, unspecified: Secondary | ICD-10-CM | POA: Diagnosis not present

## 2020-02-09 DIAGNOSIS — N1832 Chronic kidney disease, stage 3b: Secondary | ICD-10-CM | POA: Diagnosis not present

## 2020-02-09 DIAGNOSIS — I10 Essential (primary) hypertension: Secondary | ICD-10-CM | POA: Diagnosis not present

## 2020-02-13 DIAGNOSIS — R600 Localized edema: Secondary | ICD-10-CM | POA: Diagnosis not present

## 2020-02-13 DIAGNOSIS — I259 Chronic ischemic heart disease, unspecified: Secondary | ICD-10-CM | POA: Diagnosis not present

## 2020-02-13 DIAGNOSIS — L89612 Pressure ulcer of right heel, stage 2: Secondary | ICD-10-CM | POA: Diagnosis not present

## 2020-02-13 DIAGNOSIS — I10 Essential (primary) hypertension: Secondary | ICD-10-CM | POA: Diagnosis not present

## 2020-02-13 DIAGNOSIS — G4733 Obstructive sleep apnea (adult) (pediatric): Secondary | ICD-10-CM | POA: Diagnosis not present

## 2020-02-15 DIAGNOSIS — L89612 Pressure ulcer of right heel, stage 2: Secondary | ICD-10-CM | POA: Diagnosis not present

## 2020-02-15 DIAGNOSIS — G4733 Obstructive sleep apnea (adult) (pediatric): Secondary | ICD-10-CM | POA: Diagnosis not present

## 2020-02-15 DIAGNOSIS — I259 Chronic ischemic heart disease, unspecified: Secondary | ICD-10-CM | POA: Diagnosis not present

## 2020-02-15 DIAGNOSIS — I10 Essential (primary) hypertension: Secondary | ICD-10-CM | POA: Diagnosis not present

## 2020-02-15 DIAGNOSIS — R600 Localized edema: Secondary | ICD-10-CM | POA: Diagnosis not present

## 2020-02-16 DIAGNOSIS — G4733 Obstructive sleep apnea (adult) (pediatric): Secondary | ICD-10-CM | POA: Diagnosis not present

## 2020-02-16 DIAGNOSIS — I259 Chronic ischemic heart disease, unspecified: Secondary | ICD-10-CM | POA: Diagnosis not present

## 2020-02-16 DIAGNOSIS — L89612 Pressure ulcer of right heel, stage 2: Secondary | ICD-10-CM | POA: Diagnosis not present

## 2020-02-16 DIAGNOSIS — R600 Localized edema: Secondary | ICD-10-CM | POA: Diagnosis not present

## 2020-02-16 DIAGNOSIS — I10 Essential (primary) hypertension: Secondary | ICD-10-CM | POA: Diagnosis not present

## 2020-02-20 DIAGNOSIS — N184 Chronic kidney disease, stage 4 (severe): Secondary | ICD-10-CM | POA: Diagnosis not present

## 2020-02-20 DIAGNOSIS — I129 Hypertensive chronic kidney disease with stage 1 through stage 4 chronic kidney disease, or unspecified chronic kidney disease: Secondary | ICD-10-CM | POA: Diagnosis not present

## 2020-02-20 DIAGNOSIS — L89612 Pressure ulcer of right heel, stage 2: Secondary | ICD-10-CM | POA: Diagnosis not present

## 2020-02-20 DIAGNOSIS — I10 Essential (primary) hypertension: Secondary | ICD-10-CM | POA: Diagnosis not present

## 2020-02-20 DIAGNOSIS — R600 Localized edema: Secondary | ICD-10-CM | POA: Diagnosis not present

## 2020-02-20 DIAGNOSIS — G4733 Obstructive sleep apnea (adult) (pediatric): Secondary | ICD-10-CM | POA: Diagnosis not present

## 2020-02-20 DIAGNOSIS — I259 Chronic ischemic heart disease, unspecified: Secondary | ICD-10-CM | POA: Diagnosis not present

## 2020-02-21 DIAGNOSIS — E039 Hypothyroidism, unspecified: Secondary | ICD-10-CM | POA: Diagnosis not present

## 2020-02-21 DIAGNOSIS — M1990 Unspecified osteoarthritis, unspecified site: Secondary | ICD-10-CM | POA: Diagnosis not present

## 2020-02-21 DIAGNOSIS — I10 Essential (primary) hypertension: Secondary | ICD-10-CM | POA: Diagnosis not present

## 2020-02-21 DIAGNOSIS — K219 Gastro-esophageal reflux disease without esophagitis: Secondary | ICD-10-CM | POA: Diagnosis not present

## 2020-02-22 DIAGNOSIS — G4733 Obstructive sleep apnea (adult) (pediatric): Secondary | ICD-10-CM | POA: Diagnosis not present

## 2020-02-22 DIAGNOSIS — L89612 Pressure ulcer of right heel, stage 2: Secondary | ICD-10-CM | POA: Diagnosis not present

## 2020-02-22 DIAGNOSIS — I10 Essential (primary) hypertension: Secondary | ICD-10-CM | POA: Diagnosis not present

## 2020-02-22 DIAGNOSIS — I259 Chronic ischemic heart disease, unspecified: Secondary | ICD-10-CM | POA: Diagnosis not present

## 2020-02-22 DIAGNOSIS — R600 Localized edema: Secondary | ICD-10-CM | POA: Diagnosis not present

## 2020-02-24 DIAGNOSIS — I259 Chronic ischemic heart disease, unspecified: Secondary | ICD-10-CM | POA: Diagnosis not present

## 2020-02-24 DIAGNOSIS — I10 Essential (primary) hypertension: Secondary | ICD-10-CM | POA: Diagnosis not present

## 2020-02-24 DIAGNOSIS — R600 Localized edema: Secondary | ICD-10-CM | POA: Diagnosis not present

## 2020-02-24 DIAGNOSIS — L89612 Pressure ulcer of right heel, stage 2: Secondary | ICD-10-CM | POA: Diagnosis not present

## 2020-02-24 DIAGNOSIS — G4733 Obstructive sleep apnea (adult) (pediatric): Secondary | ICD-10-CM | POA: Diagnosis not present

## 2020-02-27 DIAGNOSIS — Z79899 Other long term (current) drug therapy: Secondary | ICD-10-CM | POA: Diagnosis not present

## 2020-02-27 DIAGNOSIS — Z6841 Body Mass Index (BMI) 40.0 and over, adult: Secondary | ICD-10-CM | POA: Diagnosis not present

## 2020-02-27 DIAGNOSIS — R251 Tremor, unspecified: Secondary | ICD-10-CM | POA: Diagnosis not present

## 2020-02-27 DIAGNOSIS — Z791 Long term (current) use of non-steroidal anti-inflammatories (NSAID): Secondary | ICD-10-CM | POA: Diagnosis not present

## 2020-02-27 DIAGNOSIS — I259 Chronic ischemic heart disease, unspecified: Secondary | ICD-10-CM | POA: Diagnosis not present

## 2020-02-27 DIAGNOSIS — Z9989 Dependence on other enabling machines and devices: Secondary | ICD-10-CM | POA: Diagnosis not present

## 2020-02-27 DIAGNOSIS — R600 Localized edema: Secondary | ICD-10-CM | POA: Diagnosis not present

## 2020-02-27 DIAGNOSIS — Z79811 Long term (current) use of aromatase inhibitors: Secondary | ICD-10-CM | POA: Diagnosis not present

## 2020-02-27 DIAGNOSIS — M25473 Effusion, unspecified ankle: Secondary | ICD-10-CM | POA: Diagnosis not present

## 2020-02-27 DIAGNOSIS — Z90710 Acquired absence of both cervix and uterus: Secondary | ICD-10-CM | POA: Diagnosis not present

## 2020-02-27 DIAGNOSIS — Z853 Personal history of malignant neoplasm of breast: Secondary | ICD-10-CM | POA: Diagnosis not present

## 2020-02-27 DIAGNOSIS — Z7982 Long term (current) use of aspirin: Secondary | ICD-10-CM | POA: Diagnosis not present

## 2020-02-27 DIAGNOSIS — G4733 Obstructive sleep apnea (adult) (pediatric): Secondary | ICD-10-CM | POA: Diagnosis not present

## 2020-02-27 DIAGNOSIS — I10 Essential (primary) hypertension: Secondary | ICD-10-CM | POA: Diagnosis not present

## 2020-02-27 DIAGNOSIS — L89612 Pressure ulcer of right heel, stage 2: Secondary | ICD-10-CM | POA: Diagnosis not present

## 2020-02-28 DIAGNOSIS — R600 Localized edema: Secondary | ICD-10-CM | POA: Diagnosis not present

## 2020-02-28 DIAGNOSIS — L89612 Pressure ulcer of right heel, stage 2: Secondary | ICD-10-CM | POA: Diagnosis not present

## 2020-02-28 DIAGNOSIS — I10 Essential (primary) hypertension: Secondary | ICD-10-CM | POA: Diagnosis not present

## 2020-02-28 DIAGNOSIS — G4733 Obstructive sleep apnea (adult) (pediatric): Secondary | ICD-10-CM | POA: Diagnosis not present

## 2020-02-28 DIAGNOSIS — I259 Chronic ischemic heart disease, unspecified: Secondary | ICD-10-CM | POA: Diagnosis not present

## 2020-03-06 DIAGNOSIS — I259 Chronic ischemic heart disease, unspecified: Secondary | ICD-10-CM | POA: Diagnosis not present

## 2020-03-06 DIAGNOSIS — R600 Localized edema: Secondary | ICD-10-CM | POA: Diagnosis not present

## 2020-03-06 DIAGNOSIS — L89612 Pressure ulcer of right heel, stage 2: Secondary | ICD-10-CM | POA: Diagnosis not present

## 2020-03-06 DIAGNOSIS — I10 Essential (primary) hypertension: Secondary | ICD-10-CM | POA: Diagnosis not present

## 2020-03-06 DIAGNOSIS — G4733 Obstructive sleep apnea (adult) (pediatric): Secondary | ICD-10-CM | POA: Diagnosis not present

## 2020-03-13 DIAGNOSIS — L89612 Pressure ulcer of right heel, stage 2: Secondary | ICD-10-CM | POA: Diagnosis not present

## 2020-03-13 DIAGNOSIS — I10 Essential (primary) hypertension: Secondary | ICD-10-CM | POA: Diagnosis not present

## 2020-03-13 DIAGNOSIS — G4733 Obstructive sleep apnea (adult) (pediatric): Secondary | ICD-10-CM | POA: Diagnosis not present

## 2020-03-13 DIAGNOSIS — I259 Chronic ischemic heart disease, unspecified: Secondary | ICD-10-CM | POA: Diagnosis not present

## 2020-03-13 DIAGNOSIS — R600 Localized edema: Secondary | ICD-10-CM | POA: Diagnosis not present

## 2020-03-20 DIAGNOSIS — L89612 Pressure ulcer of right heel, stage 2: Secondary | ICD-10-CM | POA: Diagnosis not present

## 2020-03-20 DIAGNOSIS — R600 Localized edema: Secondary | ICD-10-CM | POA: Diagnosis not present

## 2020-03-20 DIAGNOSIS — G4733 Obstructive sleep apnea (adult) (pediatric): Secondary | ICD-10-CM | POA: Diagnosis not present

## 2020-03-20 DIAGNOSIS — I259 Chronic ischemic heart disease, unspecified: Secondary | ICD-10-CM | POA: Diagnosis not present

## 2020-03-20 DIAGNOSIS — I10 Essential (primary) hypertension: Secondary | ICD-10-CM | POA: Diagnosis not present

## 2020-03-22 DIAGNOSIS — E039 Hypothyroidism, unspecified: Secondary | ICD-10-CM | POA: Diagnosis not present

## 2020-03-22 DIAGNOSIS — M1990 Unspecified osteoarthritis, unspecified site: Secondary | ICD-10-CM | POA: Diagnosis not present

## 2020-03-22 DIAGNOSIS — I10 Essential (primary) hypertension: Secondary | ICD-10-CM | POA: Diagnosis not present

## 2020-03-22 DIAGNOSIS — K219 Gastro-esophageal reflux disease without esophagitis: Secondary | ICD-10-CM | POA: Diagnosis not present

## 2020-03-26 DIAGNOSIS — G4733 Obstructive sleep apnea (adult) (pediatric): Secondary | ICD-10-CM | POA: Diagnosis not present

## 2020-03-26 DIAGNOSIS — I10 Essential (primary) hypertension: Secondary | ICD-10-CM | POA: Diagnosis not present

## 2020-03-26 DIAGNOSIS — L89612 Pressure ulcer of right heel, stage 2: Secondary | ICD-10-CM | POA: Diagnosis not present

## 2020-03-26 DIAGNOSIS — R600 Localized edema: Secondary | ICD-10-CM | POA: Diagnosis not present

## 2020-03-26 DIAGNOSIS — I259 Chronic ischemic heart disease, unspecified: Secondary | ICD-10-CM | POA: Diagnosis not present

## 2020-04-06 DIAGNOSIS — N184 Chronic kidney disease, stage 4 (severe): Secondary | ICD-10-CM | POA: Diagnosis not present

## 2020-04-06 DIAGNOSIS — I129 Hypertensive chronic kidney disease with stage 1 through stage 4 chronic kidney disease, or unspecified chronic kidney disease: Secondary | ICD-10-CM | POA: Diagnosis not present

## 2020-04-13 DIAGNOSIS — R2689 Other abnormalities of gait and mobility: Secondary | ICD-10-CM | POA: Diagnosis not present

## 2020-04-21 DIAGNOSIS — M1990 Unspecified osteoarthritis, unspecified site: Secondary | ICD-10-CM | POA: Diagnosis not present

## 2020-04-21 DIAGNOSIS — I10 Essential (primary) hypertension: Secondary | ICD-10-CM | POA: Diagnosis not present

## 2020-04-21 DIAGNOSIS — E039 Hypothyroidism, unspecified: Secondary | ICD-10-CM | POA: Diagnosis not present

## 2020-04-21 DIAGNOSIS — K219 Gastro-esophageal reflux disease without esophagitis: Secondary | ICD-10-CM | POA: Diagnosis not present

## 2020-04-28 DIAGNOSIS — G40909 Epilepsy, unspecified, not intractable, without status epilepticus: Secondary | ICD-10-CM | POA: Diagnosis not present

## 2020-04-28 DIAGNOSIS — G4733 Obstructive sleep apnea (adult) (pediatric): Secondary | ICD-10-CM | POA: Diagnosis not present

## 2020-04-28 DIAGNOSIS — Z9181 History of falling: Secondary | ICD-10-CM | POA: Diagnosis not present

## 2020-04-28 DIAGNOSIS — Z9989 Dependence on other enabling machines and devices: Secondary | ICD-10-CM | POA: Diagnosis not present

## 2020-04-28 DIAGNOSIS — C50911 Malignant neoplasm of unspecified site of right female breast: Secondary | ICD-10-CM | POA: Diagnosis not present

## 2020-04-28 DIAGNOSIS — I10 Essential (primary) hypertension: Secondary | ICD-10-CM | POA: Diagnosis not present

## 2020-04-28 DIAGNOSIS — E039 Hypothyroidism, unspecified: Secondary | ICD-10-CM | POA: Diagnosis not present

## 2020-04-28 DIAGNOSIS — E669 Obesity, unspecified: Secondary | ICD-10-CM | POA: Diagnosis not present

## 2020-04-28 DIAGNOSIS — Z7401 Bed confinement status: Secondary | ICD-10-CM | POA: Diagnosis not present

## 2020-04-28 DIAGNOSIS — I259 Chronic ischemic heart disease, unspecified: Secondary | ICD-10-CM | POA: Diagnosis not present

## 2020-05-01 DIAGNOSIS — I10 Essential (primary) hypertension: Secondary | ICD-10-CM | POA: Diagnosis not present

## 2020-05-01 DIAGNOSIS — C50911 Malignant neoplasm of unspecified site of right female breast: Secondary | ICD-10-CM | POA: Diagnosis not present

## 2020-05-01 DIAGNOSIS — E039 Hypothyroidism, unspecified: Secondary | ICD-10-CM | POA: Diagnosis not present

## 2020-05-01 DIAGNOSIS — G4733 Obstructive sleep apnea (adult) (pediatric): Secondary | ICD-10-CM | POA: Diagnosis not present

## 2020-05-01 DIAGNOSIS — E669 Obesity, unspecified: Secondary | ICD-10-CM | POA: Diagnosis not present

## 2020-05-01 DIAGNOSIS — G40909 Epilepsy, unspecified, not intractable, without status epilepticus: Secondary | ICD-10-CM | POA: Diagnosis not present

## 2020-05-02 DIAGNOSIS — G40909 Epilepsy, unspecified, not intractable, without status epilepticus: Secondary | ICD-10-CM | POA: Diagnosis not present

## 2020-05-02 DIAGNOSIS — E669 Obesity, unspecified: Secondary | ICD-10-CM | POA: Diagnosis not present

## 2020-05-02 DIAGNOSIS — I10 Essential (primary) hypertension: Secondary | ICD-10-CM | POA: Diagnosis not present

## 2020-05-02 DIAGNOSIS — G4733 Obstructive sleep apnea (adult) (pediatric): Secondary | ICD-10-CM | POA: Diagnosis not present

## 2020-05-02 DIAGNOSIS — C50911 Malignant neoplasm of unspecified site of right female breast: Secondary | ICD-10-CM | POA: Diagnosis not present

## 2020-05-02 DIAGNOSIS — E039 Hypothyroidism, unspecified: Secondary | ICD-10-CM | POA: Diagnosis not present

## 2020-05-03 DIAGNOSIS — I10 Essential (primary) hypertension: Secondary | ICD-10-CM | POA: Diagnosis not present

## 2020-05-03 DIAGNOSIS — E669 Obesity, unspecified: Secondary | ICD-10-CM | POA: Diagnosis not present

## 2020-05-03 DIAGNOSIS — E039 Hypothyroidism, unspecified: Secondary | ICD-10-CM | POA: Diagnosis not present

## 2020-05-03 DIAGNOSIS — C50911 Malignant neoplasm of unspecified site of right female breast: Secondary | ICD-10-CM | POA: Diagnosis not present

## 2020-05-03 DIAGNOSIS — G40909 Epilepsy, unspecified, not intractable, without status epilepticus: Secondary | ICD-10-CM | POA: Diagnosis not present

## 2020-05-03 DIAGNOSIS — G4733 Obstructive sleep apnea (adult) (pediatric): Secondary | ICD-10-CM | POA: Diagnosis not present

## 2020-05-09 DIAGNOSIS — E039 Hypothyroidism, unspecified: Secondary | ICD-10-CM | POA: Diagnosis not present

## 2020-05-10 DIAGNOSIS — C50911 Malignant neoplasm of unspecified site of right female breast: Secondary | ICD-10-CM | POA: Diagnosis not present

## 2020-05-10 DIAGNOSIS — I10 Essential (primary) hypertension: Secondary | ICD-10-CM | POA: Diagnosis not present

## 2020-05-10 DIAGNOSIS — E039 Hypothyroidism, unspecified: Secondary | ICD-10-CM | POA: Diagnosis not present

## 2020-05-10 DIAGNOSIS — G40909 Epilepsy, unspecified, not intractable, without status epilepticus: Secondary | ICD-10-CM | POA: Diagnosis not present

## 2020-05-10 DIAGNOSIS — E669 Obesity, unspecified: Secondary | ICD-10-CM | POA: Diagnosis not present

## 2020-05-10 DIAGNOSIS — G4733 Obstructive sleep apnea (adult) (pediatric): Secondary | ICD-10-CM | POA: Diagnosis not present

## 2020-05-15 ENCOUNTER — Telehealth: Payer: Self-pay | Admitting: Oncology

## 2020-05-15 NOTE — Telephone Encounter (Signed)
Renee for [facility] called to check on patient's last Appt.  September 07, 2019

## 2020-05-16 DIAGNOSIS — G40909 Epilepsy, unspecified, not intractable, without status epilepticus: Secondary | ICD-10-CM | POA: Diagnosis not present

## 2020-05-16 DIAGNOSIS — G4733 Obstructive sleep apnea (adult) (pediatric): Secondary | ICD-10-CM | POA: Diagnosis not present

## 2020-05-16 DIAGNOSIS — I10 Essential (primary) hypertension: Secondary | ICD-10-CM | POA: Diagnosis not present

## 2020-05-16 DIAGNOSIS — E039 Hypothyroidism, unspecified: Secondary | ICD-10-CM | POA: Diagnosis not present

## 2020-05-16 DIAGNOSIS — C50911 Malignant neoplasm of unspecified site of right female breast: Secondary | ICD-10-CM | POA: Diagnosis not present

## 2020-05-16 DIAGNOSIS — E669 Obesity, unspecified: Secondary | ICD-10-CM | POA: Diagnosis not present

## 2020-05-22 DIAGNOSIS — E039 Hypothyroidism, unspecified: Secondary | ICD-10-CM | POA: Diagnosis not present

## 2020-05-22 DIAGNOSIS — I10 Essential (primary) hypertension: Secondary | ICD-10-CM | POA: Diagnosis not present

## 2020-05-22 DIAGNOSIS — M1990 Unspecified osteoarthritis, unspecified site: Secondary | ICD-10-CM | POA: Diagnosis not present

## 2020-05-22 DIAGNOSIS — K219 Gastro-esophageal reflux disease without esophagitis: Secondary | ICD-10-CM | POA: Diagnosis not present

## 2020-05-23 DIAGNOSIS — E039 Hypothyroidism, unspecified: Secondary | ICD-10-CM | POA: Diagnosis not present

## 2020-05-24 DIAGNOSIS — E039 Hypothyroidism, unspecified: Secondary | ICD-10-CM | POA: Diagnosis not present

## 2020-05-24 DIAGNOSIS — E669 Obesity, unspecified: Secondary | ICD-10-CM | POA: Diagnosis not present

## 2020-05-24 DIAGNOSIS — G4733 Obstructive sleep apnea (adult) (pediatric): Secondary | ICD-10-CM | POA: Diagnosis not present

## 2020-05-24 DIAGNOSIS — C50911 Malignant neoplasm of unspecified site of right female breast: Secondary | ICD-10-CM | POA: Diagnosis not present

## 2020-05-24 DIAGNOSIS — I10 Essential (primary) hypertension: Secondary | ICD-10-CM | POA: Diagnosis not present

## 2020-05-24 DIAGNOSIS — G40909 Epilepsy, unspecified, not intractable, without status epilepticus: Secondary | ICD-10-CM | POA: Diagnosis not present

## 2020-05-28 DIAGNOSIS — G40909 Epilepsy, unspecified, not intractable, without status epilepticus: Secondary | ICD-10-CM | POA: Diagnosis not present

## 2020-05-28 DIAGNOSIS — Z7401 Bed confinement status: Secondary | ICD-10-CM | POA: Diagnosis not present

## 2020-05-28 DIAGNOSIS — Z9181 History of falling: Secondary | ICD-10-CM | POA: Diagnosis not present

## 2020-05-28 DIAGNOSIS — C50911 Malignant neoplasm of unspecified site of right female breast: Secondary | ICD-10-CM | POA: Diagnosis not present

## 2020-05-28 DIAGNOSIS — G4733 Obstructive sleep apnea (adult) (pediatric): Secondary | ICD-10-CM | POA: Diagnosis not present

## 2020-05-28 DIAGNOSIS — E039 Hypothyroidism, unspecified: Secondary | ICD-10-CM | POA: Diagnosis not present

## 2020-05-28 DIAGNOSIS — I259 Chronic ischemic heart disease, unspecified: Secondary | ICD-10-CM | POA: Diagnosis not present

## 2020-05-28 DIAGNOSIS — I10 Essential (primary) hypertension: Secondary | ICD-10-CM | POA: Diagnosis not present

## 2020-05-28 DIAGNOSIS — E669 Obesity, unspecified: Secondary | ICD-10-CM | POA: Diagnosis not present

## 2020-05-28 DIAGNOSIS — Z9989 Dependence on other enabling machines and devices: Secondary | ICD-10-CM | POA: Diagnosis not present

## 2020-06-01 DIAGNOSIS — G40909 Epilepsy, unspecified, not intractable, without status epilepticus: Secondary | ICD-10-CM | POA: Diagnosis not present

## 2020-06-01 DIAGNOSIS — E039 Hypothyroidism, unspecified: Secondary | ICD-10-CM | POA: Diagnosis not present

## 2020-06-01 DIAGNOSIS — R809 Proteinuria, unspecified: Secondary | ICD-10-CM | POA: Diagnosis not present

## 2020-06-01 DIAGNOSIS — R6889 Other general symptoms and signs: Secondary | ICD-10-CM | POA: Diagnosis not present

## 2020-06-01 DIAGNOSIS — R799 Abnormal finding of blood chemistry, unspecified: Secondary | ICD-10-CM | POA: Diagnosis not present

## 2020-06-01 DIAGNOSIS — I10 Essential (primary) hypertension: Secondary | ICD-10-CM | POA: Diagnosis not present

## 2020-06-01 DIAGNOSIS — C50911 Malignant neoplasm of unspecified site of right female breast: Secondary | ICD-10-CM | POA: Diagnosis not present

## 2020-06-01 DIAGNOSIS — E669 Obesity, unspecified: Secondary | ICD-10-CM | POA: Diagnosis not present

## 2020-06-01 DIAGNOSIS — G4733 Obstructive sleep apnea (adult) (pediatric): Secondary | ICD-10-CM | POA: Diagnosis not present

## 2020-06-06 DIAGNOSIS — G4733 Obstructive sleep apnea (adult) (pediatric): Secondary | ICD-10-CM | POA: Diagnosis not present

## 2020-06-06 DIAGNOSIS — E876 Hypokalemia: Secondary | ICD-10-CM | POA: Diagnosis not present

## 2020-06-06 DIAGNOSIS — C50911 Malignant neoplasm of unspecified site of right female breast: Secondary | ICD-10-CM | POA: Diagnosis not present

## 2020-06-06 DIAGNOSIS — R600 Localized edema: Secondary | ICD-10-CM | POA: Diagnosis not present

## 2020-06-06 DIAGNOSIS — I10 Essential (primary) hypertension: Secondary | ICD-10-CM | POA: Diagnosis not present

## 2020-06-06 DIAGNOSIS — E039 Hypothyroidism, unspecified: Secondary | ICD-10-CM | POA: Diagnosis not present

## 2020-06-06 DIAGNOSIS — G992 Myelopathy in diseases classified elsewhere: Secondary | ICD-10-CM | POA: Diagnosis not present

## 2020-06-06 DIAGNOSIS — G40909 Epilepsy, unspecified, not intractable, without status epilepticus: Secondary | ICD-10-CM | POA: Diagnosis not present

## 2020-06-06 DIAGNOSIS — I129 Hypertensive chronic kidney disease with stage 1 through stage 4 chronic kidney disease, or unspecified chronic kidney disease: Secondary | ICD-10-CM | POA: Diagnosis not present

## 2020-06-06 DIAGNOSIS — E669 Obesity, unspecified: Secondary | ICD-10-CM | POA: Diagnosis not present

## 2020-06-06 DIAGNOSIS — M4802 Spinal stenosis, cervical region: Secondary | ICD-10-CM | POA: Diagnosis not present

## 2020-06-06 DIAGNOSIS — I5032 Chronic diastolic (congestive) heart failure: Secondary | ICD-10-CM | POA: Diagnosis not present

## 2020-06-06 DIAGNOSIS — N189 Chronic kidney disease, unspecified: Secondary | ICD-10-CM | POA: Diagnosis not present

## 2020-06-06 DIAGNOSIS — K219 Gastro-esophageal reflux disease without esophagitis: Secondary | ICD-10-CM | POA: Diagnosis not present

## 2020-06-06 DIAGNOSIS — N179 Acute kidney failure, unspecified: Secondary | ICD-10-CM | POA: Diagnosis not present

## 2020-06-07 DIAGNOSIS — R7989 Other specified abnormal findings of blood chemistry: Secondary | ICD-10-CM | POA: Diagnosis not present

## 2020-06-11 DIAGNOSIS — Z13228 Encounter for screening for other metabolic disorders: Secondary | ICD-10-CM | POA: Diagnosis not present

## 2020-06-12 DIAGNOSIS — I10 Essential (primary) hypertension: Secondary | ICD-10-CM | POA: Diagnosis not present

## 2020-06-12 DIAGNOSIS — G4733 Obstructive sleep apnea (adult) (pediatric): Secondary | ICD-10-CM | POA: Diagnosis not present

## 2020-06-12 DIAGNOSIS — C50911 Malignant neoplasm of unspecified site of right female breast: Secondary | ICD-10-CM | POA: Diagnosis not present

## 2020-06-12 DIAGNOSIS — E039 Hypothyroidism, unspecified: Secondary | ICD-10-CM | POA: Diagnosis not present

## 2020-06-12 DIAGNOSIS — G40909 Epilepsy, unspecified, not intractable, without status epilepticus: Secondary | ICD-10-CM | POA: Diagnosis not present

## 2020-06-12 DIAGNOSIS — E669 Obesity, unspecified: Secondary | ICD-10-CM | POA: Diagnosis not present

## 2020-06-21 DIAGNOSIS — E039 Hypothyroidism, unspecified: Secondary | ICD-10-CM | POA: Diagnosis not present

## 2020-06-21 DIAGNOSIS — I10 Essential (primary) hypertension: Secondary | ICD-10-CM | POA: Diagnosis not present

## 2020-06-21 DIAGNOSIS — G40309 Generalized idiopathic epilepsy and epileptic syndromes, not intractable, without status epilepticus: Secondary | ICD-10-CM | POA: Diagnosis not present

## 2020-07-19 DIAGNOSIS — Z853 Personal history of malignant neoplasm of breast: Secondary | ICD-10-CM | POA: Diagnosis not present

## 2020-07-19 DIAGNOSIS — I259 Chronic ischemic heart disease, unspecified: Secondary | ICD-10-CM | POA: Diagnosis not present

## 2020-07-19 DIAGNOSIS — I129 Hypertensive chronic kidney disease with stage 1 through stage 4 chronic kidney disease, or unspecified chronic kidney disease: Secondary | ICD-10-CM | POA: Diagnosis not present

## 2020-07-22 DIAGNOSIS — M1990 Unspecified osteoarthritis, unspecified site: Secondary | ICD-10-CM | POA: Diagnosis not present

## 2020-07-22 DIAGNOSIS — I10 Essential (primary) hypertension: Secondary | ICD-10-CM | POA: Diagnosis not present

## 2020-07-22 DIAGNOSIS — K219 Gastro-esophageal reflux disease without esophagitis: Secondary | ICD-10-CM | POA: Diagnosis not present

## 2020-07-22 DIAGNOSIS — E039 Hypothyroidism, unspecified: Secondary | ICD-10-CM | POA: Diagnosis not present

## 2020-07-24 DIAGNOSIS — N1832 Chronic kidney disease, stage 3b: Secondary | ICD-10-CM | POA: Diagnosis not present

## 2020-07-24 DIAGNOSIS — N390 Urinary tract infection, site not specified: Secondary | ICD-10-CM | POA: Diagnosis not present

## 2020-08-08 DIAGNOSIS — N390 Urinary tract infection, site not specified: Secondary | ICD-10-CM | POA: Diagnosis not present

## 2020-08-20 DIAGNOSIS — M1990 Unspecified osteoarthritis, unspecified site: Secondary | ICD-10-CM | POA: Diagnosis not present

## 2020-08-20 DIAGNOSIS — E039 Hypothyroidism, unspecified: Secondary | ICD-10-CM | POA: Diagnosis not present

## 2020-08-20 DIAGNOSIS — I10 Essential (primary) hypertension: Secondary | ICD-10-CM | POA: Diagnosis not present

## 2020-08-20 DIAGNOSIS — K219 Gastro-esophageal reflux disease without esophagitis: Secondary | ICD-10-CM | POA: Diagnosis not present

## 2020-08-30 DIAGNOSIS — Z79899 Other long term (current) drug therapy: Secondary | ICD-10-CM | POA: Diagnosis not present

## 2020-08-30 DIAGNOSIS — Z6841 Body Mass Index (BMI) 40.0 and over, adult: Secondary | ICD-10-CM | POA: Diagnosis not present

## 2020-08-30 DIAGNOSIS — Z0000 Encounter for general adult medical examination without abnormal findings: Secondary | ICD-10-CM | POA: Diagnosis not present

## 2020-08-30 DIAGNOSIS — E039 Hypothyroidism, unspecified: Secondary | ICD-10-CM | POA: Diagnosis not present

## 2020-09-18 DIAGNOSIS — N1832 Chronic kidney disease, stage 3b: Secondary | ICD-10-CM | POA: Diagnosis not present

## 2020-09-19 DIAGNOSIS — E039 Hypothyroidism, unspecified: Secondary | ICD-10-CM | POA: Diagnosis not present

## 2020-09-19 DIAGNOSIS — M1990 Unspecified osteoarthritis, unspecified site: Secondary | ICD-10-CM | POA: Diagnosis not present

## 2020-09-19 DIAGNOSIS — K219 Gastro-esophageal reflux disease without esophagitis: Secondary | ICD-10-CM | POA: Diagnosis not present

## 2020-09-19 DIAGNOSIS — I10 Essential (primary) hypertension: Secondary | ICD-10-CM | POA: Diagnosis not present

## 2020-09-20 DIAGNOSIS — N1832 Chronic kidney disease, stage 3b: Secondary | ICD-10-CM | POA: Diagnosis not present

## 2020-09-23 ENCOUNTER — Other Ambulatory Visit: Payer: Self-pay | Admitting: Oncology

## 2020-09-25 ENCOUNTER — Other Ambulatory Visit: Payer: Self-pay | Admitting: Oncology

## 2020-09-25 ENCOUNTER — Telehealth: Payer: Self-pay

## 2020-09-25 NOTE — Telephone Encounter (Signed)
-----   Message from Marvia Pickles, PA-C sent at 09/25/2020  3:56 PM EDT ----- Contact: [phone] She would have completed 5 years of anastrozole in September. I do not know if they can do a mammo on someone in a w/c. I tried to call just now, but no answer. Thanks for making the appt. I guess we can decide on mammo at that visit. ----- Message ----- From: Dairl Ponder, RN Sent: 09/25/2020   2:43 PM EDT To: Marvia Pickles, PA-C  He thinks she has completed the anastrozole. She didn't have mammogram back in July 2021, as [facility] cancelled her transportation.  Pt's spouse asked if they could do her mammo since she is in wheelchair? I made a f/u appt with Dr Bobby Rumpf for 10/11/20.   ----- Message ----- From: Marvia Pickles, PA-C Sent: 09/25/2020   2:18 PM EDT To: Dairl Ponder, RN  Anastrozole for her breast cancer ----- Message ----- From: Dairl Ponder, RN Sent: 09/25/2020   2:09 PM EDT To: Marvia Pickles, PA-C  I spoke with pt's husband, Iona Beard. He confirms that she is not on Hospice. She has been seen by home health. He asked which medication did we get a request for?  Uses [facility]  ----- Message ----- From: Marvia Pickles, PA-C Sent: 09/24/2020   2:23 PM EDT To: Dairl Ponder, RN  I'm trying to help Dr. Bobby Rumpf with his prescriptions, but all his patients are difficult. Derrica Sieg hasn't been seen in a year and was due for mammo and f/u in Sept. Based on visits in [place], she is on hospice. I'm not sure what's going on with her, but I don't want to prescribe without more information. Thanks!

## 2020-10-11 NOTE — Progress Notes (Signed)
[facility]  [address] [place],  [place]  [address] [phone]  Clinic Day:  10/12/2020  Referring physician: Ronita Hipps, MD   HISTORY OF PRESENT ILLNESS:  The patient is a 70 y.o. 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 The patient also underwent adjuvant breast radiation to prevent local disease recurrence.  She is currently taking anastrozole on a daily basis for her adjuvant endocrine management.  She comes in today to re-establish follow up.  Since her last visit, the patient has run into multiple medical issues.  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  She is apparently scheduled for back surgery in the forthcoming weeks.  As it pertains to her breast cancer, she continues to deny having any breast changes which concern her for disease recurrence. Per hospital records, she has not had a mammogram since July 2020.   PHYSICAL EXAM:  Blood pressure (!) 107/59, pulse 100, temperature 98.7 F (37.1 C), resp. rate 14, height 5\' 3"  (1.6 m), weight 140 lb (63.5 kg), SpO2 99 %. Wt Readings from Last 3 Encounters:  10/12/20 140 lb (63.5 kg)  06/06/19 236 lb (107 kg)   Body mass index is 24.8 kg/m. Performance status (ECOG): 3 Physical Exam Constitutional:      Appearance: Normal appearance. She is ill-appearing.     Comments: Chronically ill-appearing woman in a wheelchair; she has lost a significant amount of weight since her last visit  HENT:     Mouth/Throat:     Pharynx: Oropharynx is clear. No oropharyngeal exudate.  Cardiovascular:     Rate and Rhythm: Normal rate and regular rhythm.     Heart sounds: No murmur heard. No friction rub. No gallop.   Pulmonary:     Breath sounds: Normal breath sounds.  Chest:  Breasts:     Right: Normal. No swelling, bleeding, inverted nipple, mass, nipple discharge, skin change,  axillary adenopathy or supraclavicular adenopathy.     Left: Normal. No swelling, bleeding, inverted nipple, mass, nipple discharge, skin change, axillary adenopathy or supraclavicular adenopathy.    Abdominal:     General: Bowel sounds are normal. There is no distension.     Palpations: Abdomen is soft. There is no mass.     Tenderness: There is no abdominal tenderness.  Musculoskeletal:        General: No tenderness.     Cervical back: Normal range of motion and neck supple.     Right lower leg: No edema.     Left lower leg: No edema.  Lymphadenopathy:     Cervical: No cervical adenopathy.     Right cervical: No superficial, deep or posterior cervical adenopathy.    Left cervical: No superficial, deep or posterior cervical adenopathy.     Upper Body:     Right upper body: No supraclavicular or axillary adenopathy.     Left upper body: No supraclavicular or axillary adenopathy.     Lower Body: No right inguinal adenopathy. No left inguinal adenopathy.  Skin:    Coloration: Skin is not jaundiced.     Findings: No lesion or rash.  Neurological:     General: No focal deficit present.     Mental Status: She is alert and oriented to person, place, and time. Mental status is at baseline.  Psychiatric:        Mood and Affect:  Mood normal.        Behavior: Behavior normal.        Thought Content: Thought content normal.        Judgment: Judgment normal.    ASSESSMENT & PLAN:  Assessment/Plan:  A 70 y.o. 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  Based upon her clinical breast exam today, the patient remains disease free.  She knows to continue her anastrozole to complete 5 full years of endocrine therapy, which she should be near to doing.  There clearly has been a decline in her health from her other medical issues, but she is doing well from a breast cancer perspective.  I will ensure she gets back to taking her  annual mammograms, for which I will schedule the next one to be done in the forthcoming weeks.  As she is over 5 years out from surgery, I will see her back in 1 year for repeat clinical assessment. The patient understands all the plans discussed today and is in agreement with them.    Brodyn Depuy Macarthur Critchley, MD

## 2020-10-12 ENCOUNTER — Inpatient Hospital Stay: Payer: Medicare Other | Attending: Oncology | Admitting: Oncology

## 2020-10-12 ENCOUNTER — Telehealth: Payer: Self-pay | Admitting: Oncology

## 2020-10-12 ENCOUNTER — Other Ambulatory Visit: Payer: Self-pay

## 2020-10-12 ENCOUNTER — Other Ambulatory Visit: Payer: Self-pay | Admitting: Oncology

## 2020-10-12 VITALS — BP 107/59 | HR 100 | Temp 98.7°F | Resp 14 | Ht 63.0 in | Wt 140.0 lb

## 2020-10-12 DIAGNOSIS — Z170 Estrogen receptor positive status [ER+]: Secondary | ICD-10-CM

## 2020-10-12 DIAGNOSIS — C50111 Malignant neoplasm of central portion of right female breast: Secondary | ICD-10-CM | POA: Diagnosis not present

## 2020-10-12 DIAGNOSIS — C50211 Malignant neoplasm of upper-inner quadrant of right female breast: Secondary | ICD-10-CM

## 2020-10-12 NOTE — Telephone Encounter (Signed)
Per 4/22 LOS, patient scheduled for 5/18 Mammo -  Scheduled for April 2023 Appt.  Gave patient Appt Summary/Orders

## 2020-10-20 DIAGNOSIS — I10 Essential (primary) hypertension: Secondary | ICD-10-CM | POA: Diagnosis not present

## 2020-10-20 DIAGNOSIS — E039 Hypothyroidism, unspecified: Secondary | ICD-10-CM | POA: Diagnosis not present

## 2020-10-20 DIAGNOSIS — M1990 Unspecified osteoarthritis, unspecified site: Secondary | ICD-10-CM | POA: Diagnosis not present

## 2020-10-20 DIAGNOSIS — K219 Gastro-esophageal reflux disease without esophagitis: Secondary | ICD-10-CM | POA: Diagnosis not present

## 2020-11-08 DIAGNOSIS — R419 Unspecified symptoms and signs involving cognitive functions and awareness: Secondary | ICD-10-CM | POA: Diagnosis not present

## 2020-11-08 DIAGNOSIS — R251 Tremor, unspecified: Secondary | ICD-10-CM | POA: Diagnosis not present

## 2020-11-08 DIAGNOSIS — M4802 Spinal stenosis, cervical region: Secondary | ICD-10-CM | POA: Diagnosis not present

## 2020-11-08 DIAGNOSIS — N189 Chronic kidney disease, unspecified: Secondary | ICD-10-CM | POA: Diagnosis not present

## 2020-11-08 DIAGNOSIS — M6281 Muscle weakness (generalized): Secondary | ICD-10-CM | POA: Diagnosis not present

## 2020-11-08 DIAGNOSIS — R2689 Other abnormalities of gait and mobility: Secondary | ICD-10-CM | POA: Diagnosis not present

## 2020-11-19 DIAGNOSIS — E039 Hypothyroidism, unspecified: Secondary | ICD-10-CM | POA: Diagnosis not present

## 2020-11-19 DIAGNOSIS — K219 Gastro-esophageal reflux disease without esophagitis: Secondary | ICD-10-CM | POA: Diagnosis not present

## 2020-11-19 DIAGNOSIS — I10 Essential (primary) hypertension: Secondary | ICD-10-CM | POA: Diagnosis not present

## 2020-11-19 DIAGNOSIS — M1990 Unspecified osteoarthritis, unspecified site: Secondary | ICD-10-CM | POA: Diagnosis not present

## 2020-11-20 DIAGNOSIS — N1832 Chronic kidney disease, stage 3b: Secondary | ICD-10-CM | POA: Diagnosis not present

## 2020-11-20 DIAGNOSIS — I259 Chronic ischemic heart disease, unspecified: Secondary | ICD-10-CM | POA: Diagnosis not present

## 2020-11-20 DIAGNOSIS — N184 Chronic kidney disease, stage 4 (severe): Secondary | ICD-10-CM | POA: Diagnosis not present

## 2020-11-23 DIAGNOSIS — N390 Urinary tract infection, site not specified: Secondary | ICD-10-CM | POA: Diagnosis not present

## 2020-12-20 DIAGNOSIS — M1990 Unspecified osteoarthritis, unspecified site: Secondary | ICD-10-CM | POA: Diagnosis not present

## 2020-12-20 DIAGNOSIS — I10 Essential (primary) hypertension: Secondary | ICD-10-CM | POA: Diagnosis not present

## 2020-12-20 DIAGNOSIS — K219 Gastro-esophageal reflux disease without esophagitis: Secondary | ICD-10-CM | POA: Diagnosis not present

## 2020-12-20 DIAGNOSIS — N184 Chronic kidney disease, stage 4 (severe): Secondary | ICD-10-CM | POA: Diagnosis not present

## 2020-12-20 DIAGNOSIS — E039 Hypothyroidism, unspecified: Secondary | ICD-10-CM | POA: Diagnosis not present

## 2020-12-20 DIAGNOSIS — I259 Chronic ischemic heart disease, unspecified: Secondary | ICD-10-CM | POA: Diagnosis not present

## 2021-01-20 DIAGNOSIS — E039 Hypothyroidism, unspecified: Secondary | ICD-10-CM | POA: Diagnosis not present

## 2021-01-20 DIAGNOSIS — I10 Essential (primary) hypertension: Secondary | ICD-10-CM | POA: Diagnosis not present

## 2021-01-20 DIAGNOSIS — M1990 Unspecified osteoarthritis, unspecified site: Secondary | ICD-10-CM | POA: Diagnosis not present

## 2021-01-20 DIAGNOSIS — I259 Chronic ischemic heart disease, unspecified: Secondary | ICD-10-CM | POA: Diagnosis not present

## 2021-01-20 DIAGNOSIS — N184 Chronic kidney disease, stage 4 (severe): Secondary | ICD-10-CM | POA: Diagnosis not present

## 2021-01-20 DIAGNOSIS — K219 Gastro-esophageal reflux disease without esophagitis: Secondary | ICD-10-CM | POA: Diagnosis not present

## 2021-01-23 DIAGNOSIS — Z20822 Contact with and (suspected) exposure to covid-19: Secondary | ICD-10-CM | POA: Diagnosis not present

## 2021-02-04 DIAGNOSIS — N1832 Chronic kidney disease, stage 3b: Secondary | ICD-10-CM | POA: Diagnosis not present

## 2021-02-20 DIAGNOSIS — I259 Chronic ischemic heart disease, unspecified: Secondary | ICD-10-CM | POA: Diagnosis not present

## 2021-02-20 DIAGNOSIS — E039 Hypothyroidism, unspecified: Secondary | ICD-10-CM | POA: Diagnosis not present

## 2021-02-20 DIAGNOSIS — M1990 Unspecified osteoarthritis, unspecified site: Secondary | ICD-10-CM | POA: Diagnosis not present

## 2021-02-20 DIAGNOSIS — I10 Essential (primary) hypertension: Secondary | ICD-10-CM | POA: Diagnosis not present

## 2021-02-20 DIAGNOSIS — N184 Chronic kidney disease, stage 4 (severe): Secondary | ICD-10-CM | POA: Diagnosis not present

## 2021-02-20 DIAGNOSIS — K219 Gastro-esophageal reflux disease without esophagitis: Secondary | ICD-10-CM | POA: Diagnosis not present

## 2021-03-05 ENCOUNTER — Other Ambulatory Visit: Payer: Self-pay | Admitting: Neurosurgery

## 2021-03-05 ENCOUNTER — Other Ambulatory Visit (HOSPITAL_COMMUNITY): Payer: Self-pay | Admitting: Neurosurgery

## 2021-03-05 DIAGNOSIS — M25562 Pain in left knee: Secondary | ICD-10-CM

## 2021-03-05 DIAGNOSIS — R29898 Other symptoms and signs involving the musculoskeletal system: Secondary | ICD-10-CM | POA: Diagnosis not present

## 2021-03-05 DIAGNOSIS — M25561 Pain in right knee: Secondary | ICD-10-CM | POA: Diagnosis not present

## 2021-03-05 DIAGNOSIS — M4722 Other spondylosis with radiculopathy, cervical region: Secondary | ICD-10-CM

## 2021-03-05 DIAGNOSIS — G8929 Other chronic pain: Secondary | ICD-10-CM

## 2021-03-18 ENCOUNTER — Ambulatory Visit (HOSPITAL_COMMUNITY)
Admission: RE | Admit: 2021-03-18 | Discharge: 2021-03-18 | Disposition: A | Discharge status: Home / Self Care | Payer: Medicare Other | Source: Ambulatory Visit | Attending: Neurosurgery | Admitting: Neurosurgery

## 2021-03-18 ENCOUNTER — Other Ambulatory Visit: Payer: Self-pay

## 2021-03-18 DIAGNOSIS — R531 Weakness: Secondary | ICD-10-CM | POA: Diagnosis not present

## 2021-03-18 DIAGNOSIS — G8929 Other chronic pain: Secondary | ICD-10-CM | POA: Diagnosis not present

## 2021-03-18 DIAGNOSIS — M25561 Pain in right knee: Secondary | ICD-10-CM | POA: Insufficient documentation

## 2021-03-18 DIAGNOSIS — M25562 Pain in left knee: Secondary | ICD-10-CM | POA: Diagnosis not present

## 2021-03-18 DIAGNOSIS — M1712 Unilateral primary osteoarthritis, left knee: Secondary | ICD-10-CM | POA: Diagnosis not present

## 2021-03-18 DIAGNOSIS — R296 Repeated falls: Secondary | ICD-10-CM | POA: Diagnosis not present

## 2021-03-18 DIAGNOSIS — M4722 Other spondylosis with radiculopathy, cervical region: Secondary | ICD-10-CM | POA: Insufficient documentation

## 2021-03-18 DIAGNOSIS — M1711 Unilateral primary osteoarthritis, right knee: Secondary | ICD-10-CM | POA: Diagnosis not present

## 2021-03-18 DIAGNOSIS — K8020 Calculus of gallbladder without cholecystitis without obstruction: Secondary | ICD-10-CM | POA: Diagnosis not present

## 2021-03-18 DIAGNOSIS — R29898 Other symptoms and signs involving the musculoskeletal system: Secondary | ICD-10-CM | POA: Diagnosis not present

## 2021-03-18 DIAGNOSIS — R251 Tremor, unspecified: Secondary | ICD-10-CM | POA: Diagnosis not present

## 2021-03-18 DIAGNOSIS — M48061 Spinal stenosis, lumbar region without neurogenic claudication: Secondary | ICD-10-CM | POA: Diagnosis not present

## 2021-03-18 DIAGNOSIS — M4802 Spinal stenosis, cervical region: Secondary | ICD-10-CM | POA: Diagnosis not present

## 2021-03-18 IMAGING — CR DG KNEE COMPLETE 4+V*R*
4 series · 4 of 4 positions shown · non-contrast
Comparison: None.

CLINICAL DATA: Chronic bilateral knee pain without known injury.

EXAM:
RIGHT KNEE - COMPLETE 4+ VIEW

[knee ap]
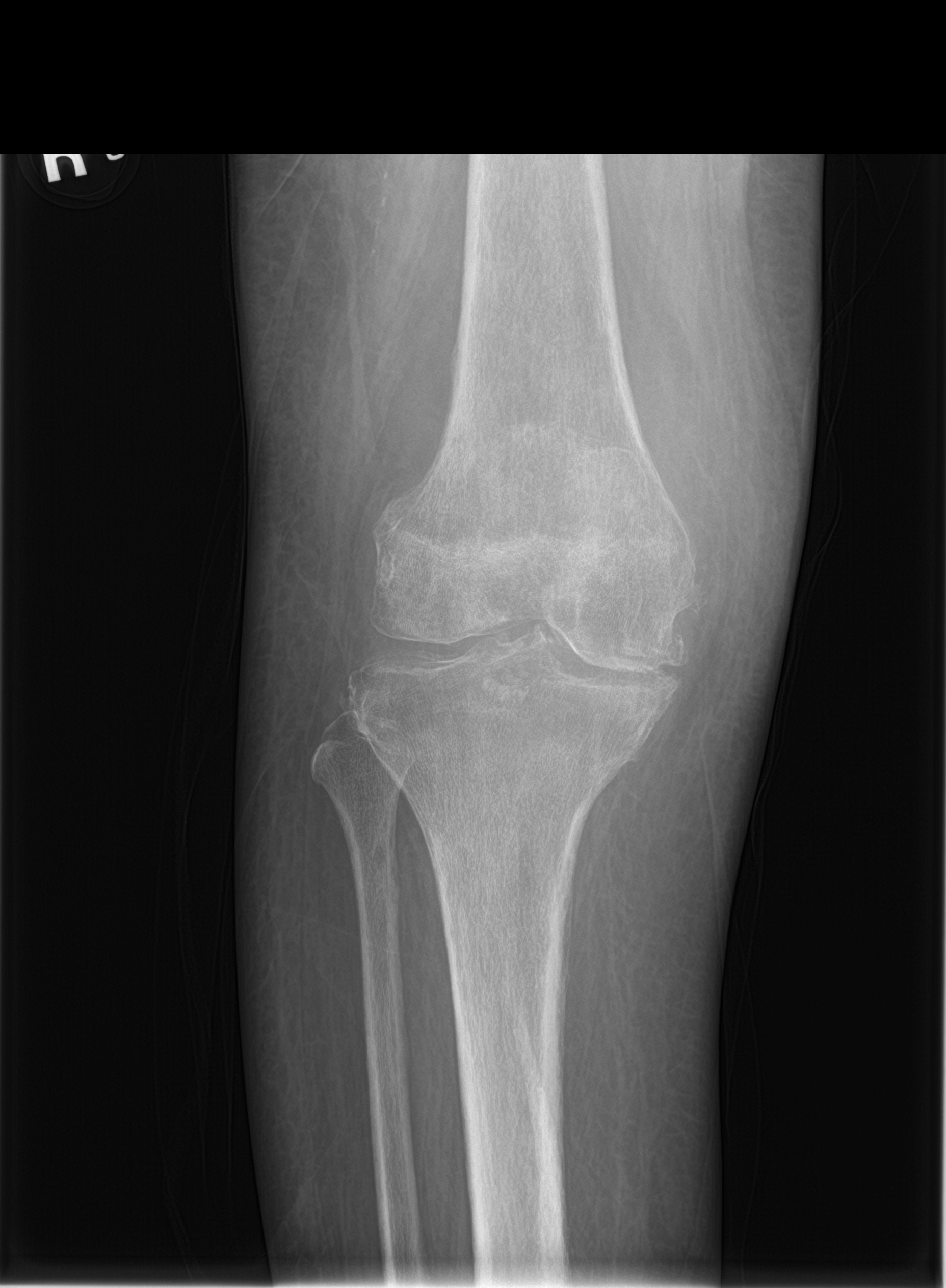

[knee lat]
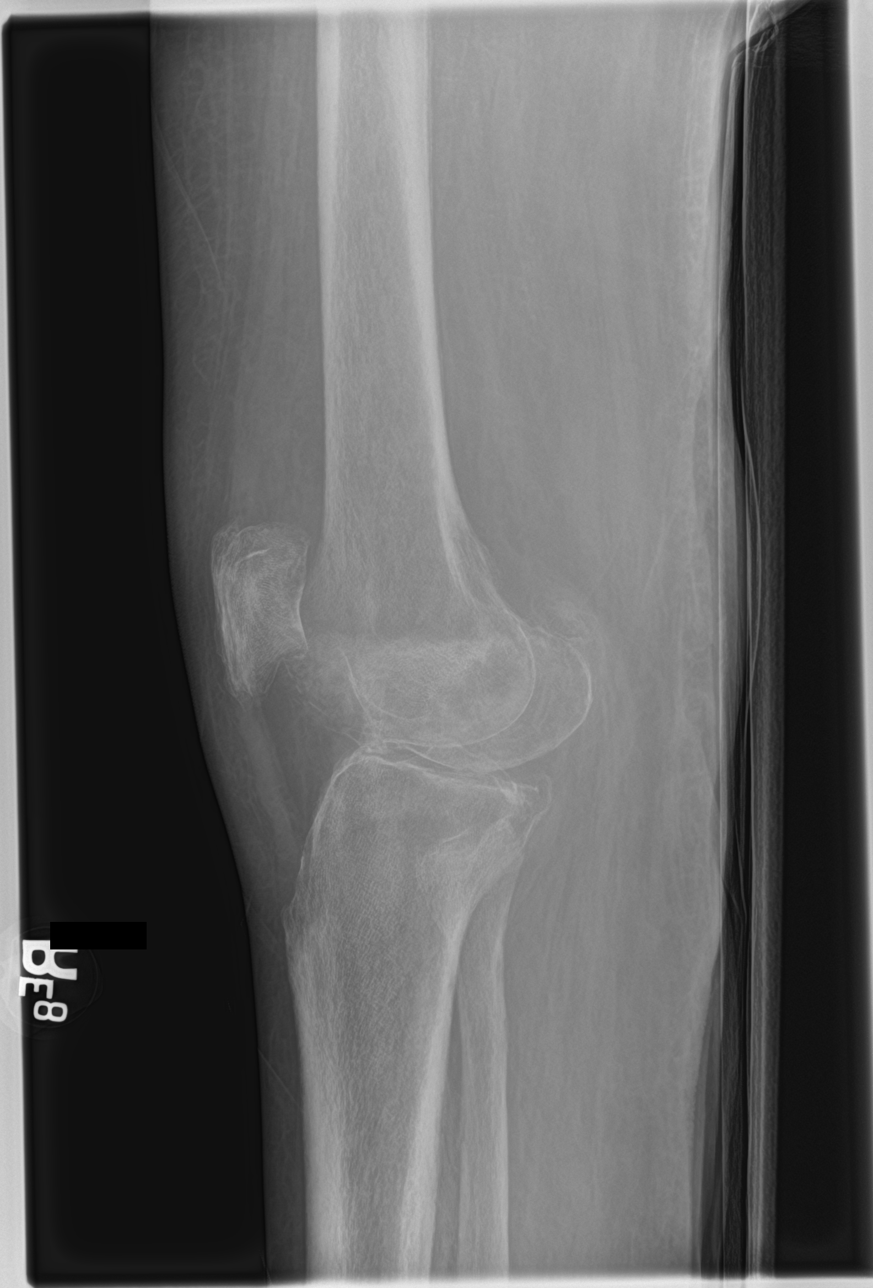

[knee obl (1 of 2)]
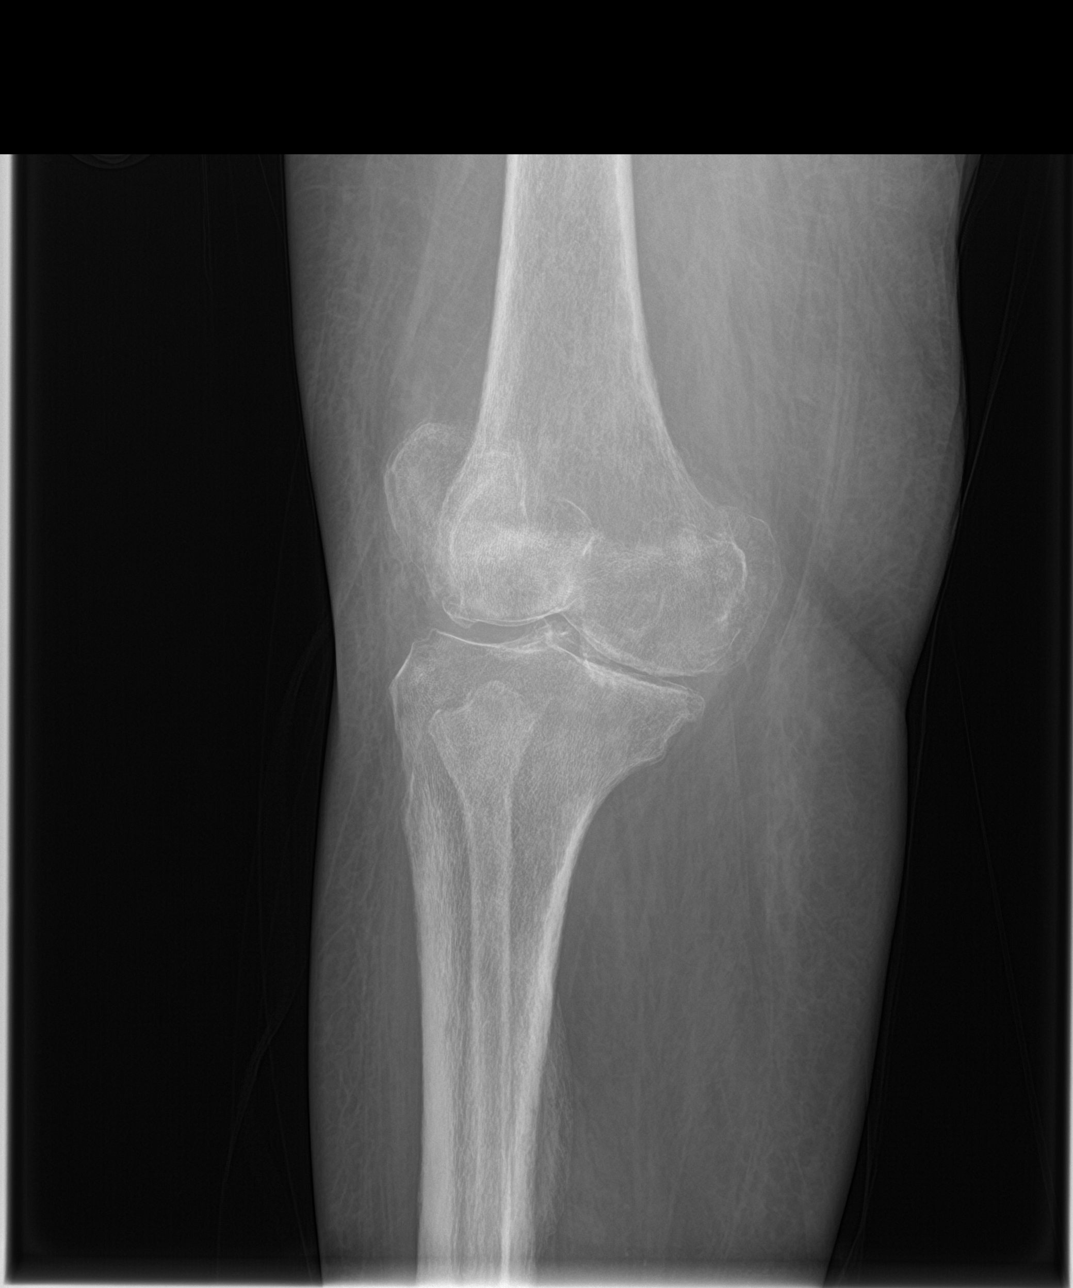

[knee obl (2 of 2)]
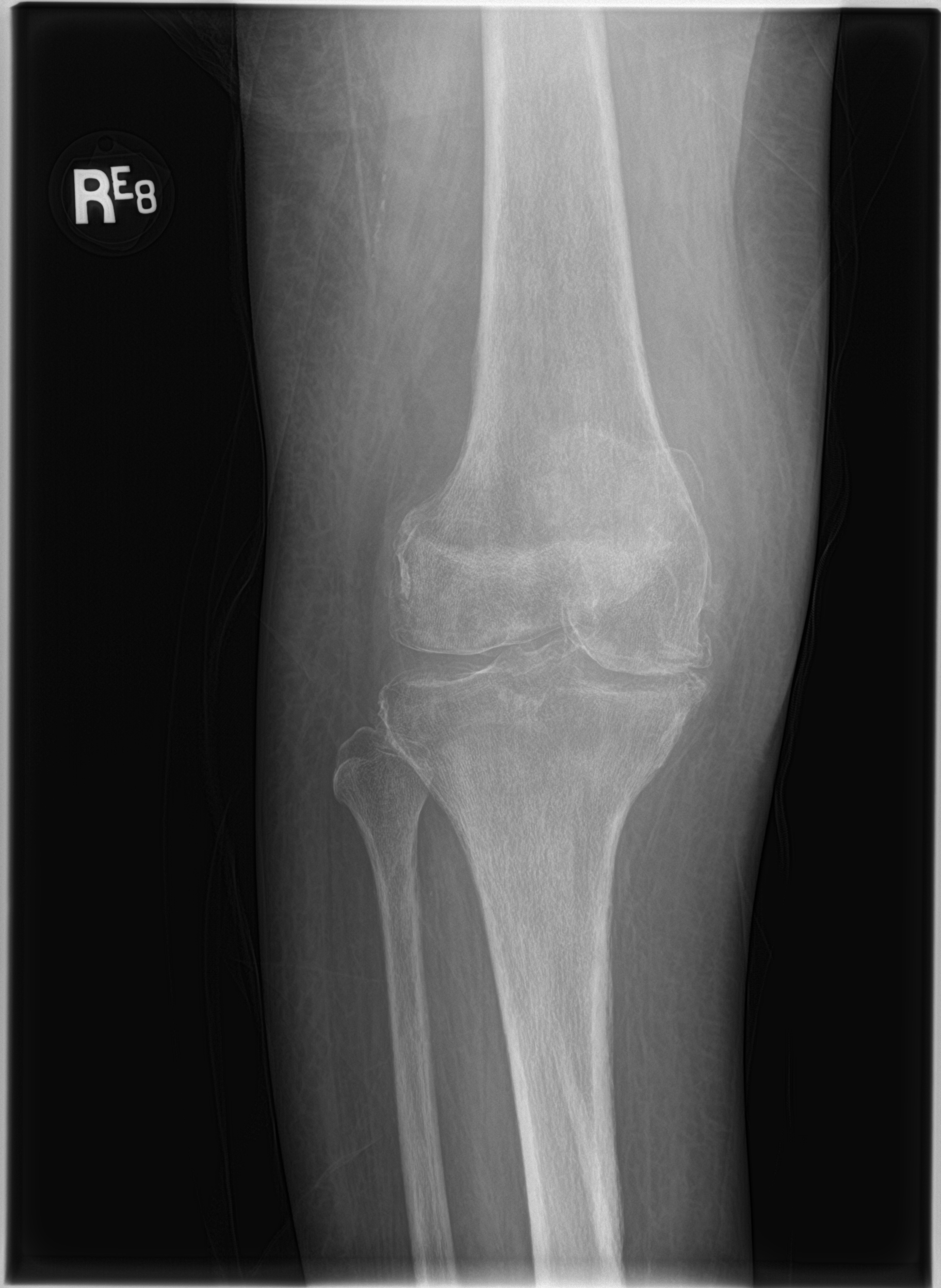

[4 of 4 positions shown; findings below may reference images not displayed]

FINDINGS: No evidence of fracture, dislocation, or joint effusion. Moderate
narrowing of medial joint space is noted. Soft tissues are
unremarkable.
IMPRESSION: Moderate degenerative joint disease is noted medially. No acute
abnormality seen.

## 2021-03-18 IMAGING — MR MR LUMBAR SPINE W/O CM
4 of 5 series · 18 of 48 positions shown · non-contrast
Comparison: Thoracic MRI the same day reported separately. CT
Abdomen and Pelvis [DATE].

CLINICAL DATA: 70-year-old female with bilateral lower extremity
weakness. Unsteady gait. Frequent falls. Bilateral hand tremor.
Spondylosis, radiculopathy.

EXAM:
MRI LUMBAR SPINE WITHOUT CONTRAST
TECHNIQUE: Multiplanar, multisequence MR imaging of the lumbar spine was
performed. No intravenous contrast was administered.

[Series 15: T2 · sagittal · 4.0mm · 0.55mm/px · 6 of 14 slices shown (1 of 2)]
[im 1/14]
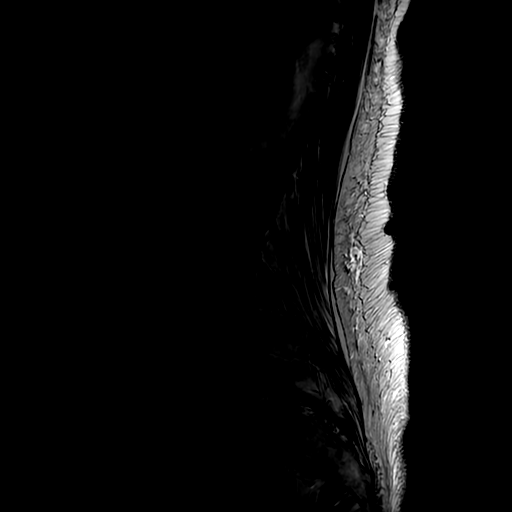
[im 3/14]
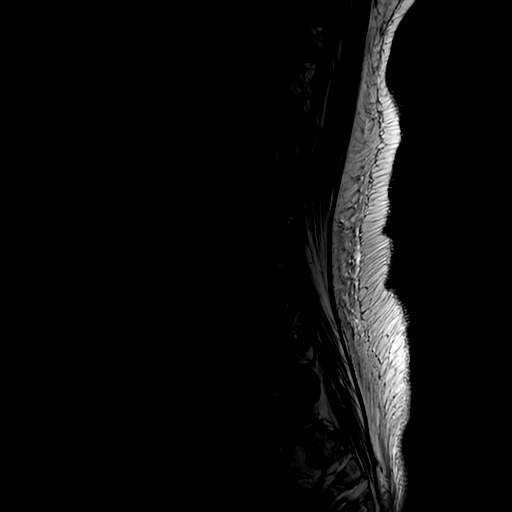
[im 6/14]
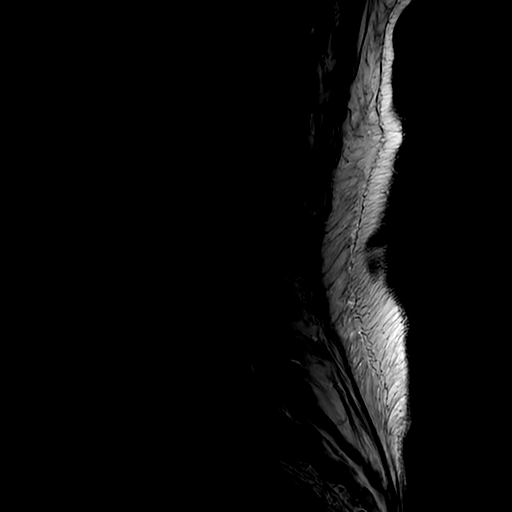
[im 8/14]
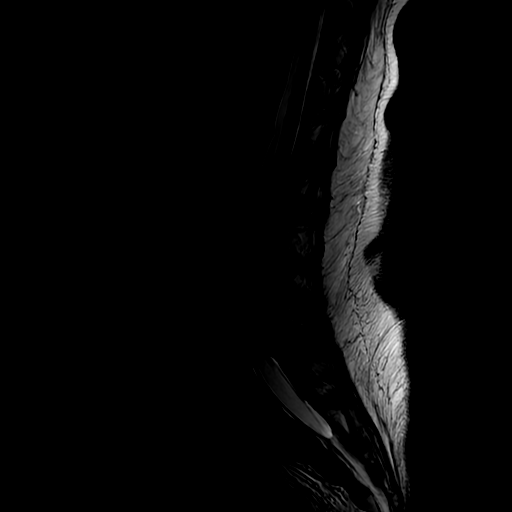
[im 11/14]
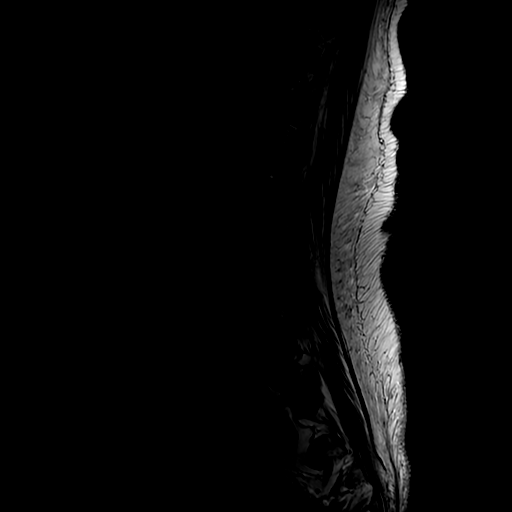
[im 14/14]
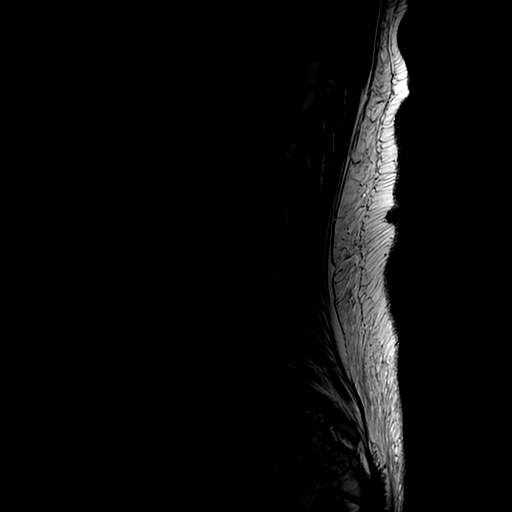

[Series 17: T1 · sagittal · 4.0mm · 0.55mm/px · 3 of 14 slices shown (1 of 2)]
[im 3/14]
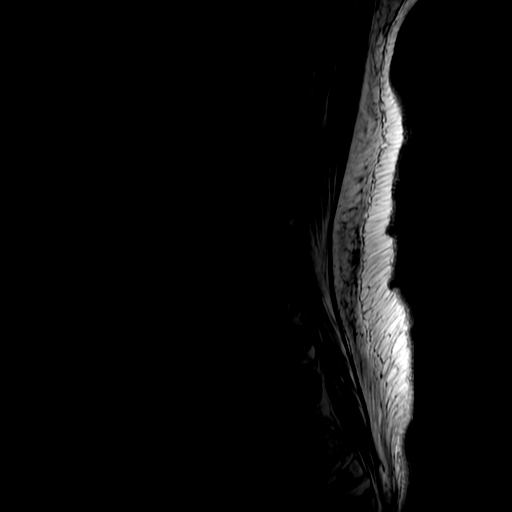
[im 7/14]
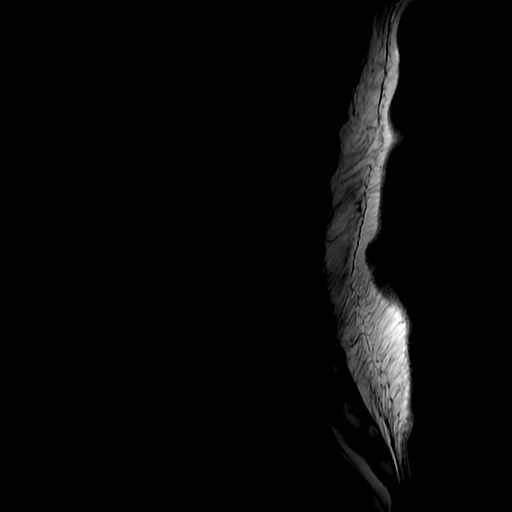
[im 11/14]
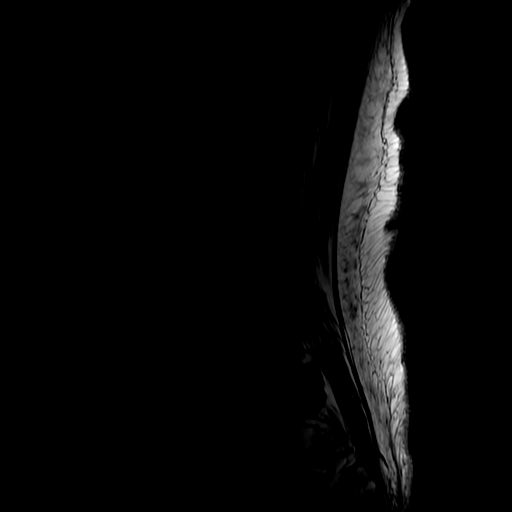

[Series 18: T2 · axial · 4.0mm · 0.39mm/px · z∈[-457,-295]mm · 6 of 30 slices shown (2 of 2)]
[im 1/30]
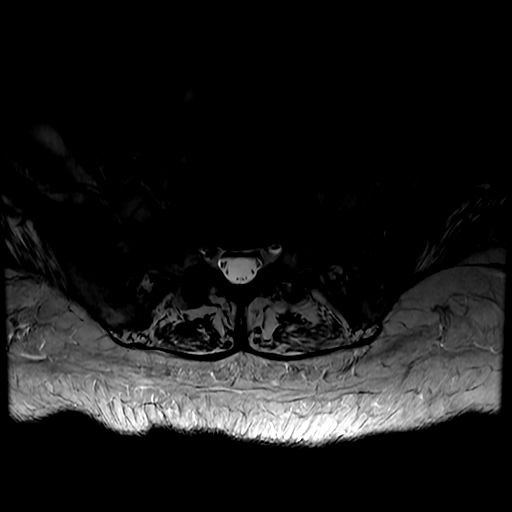
[im 5/30]
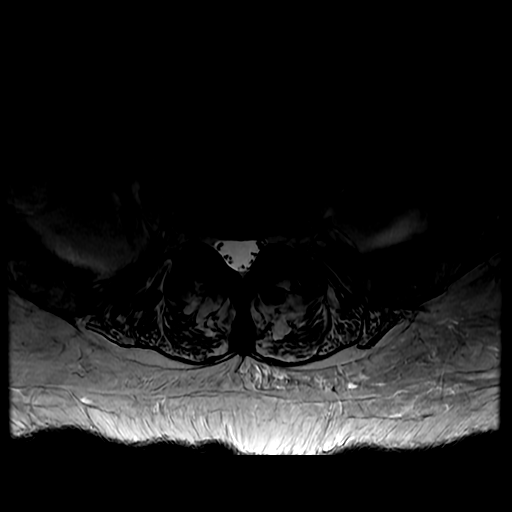
[im 9/30]
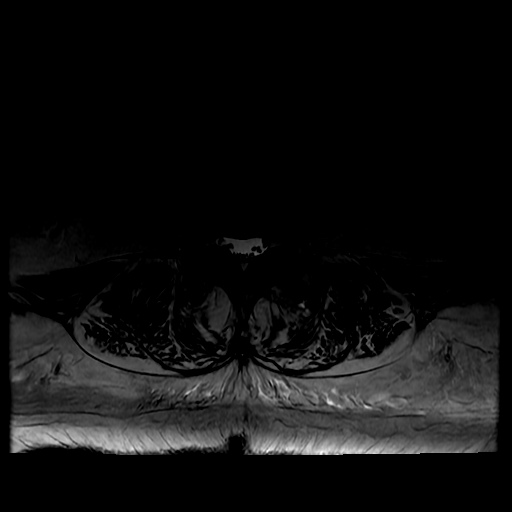
[im 14/30]
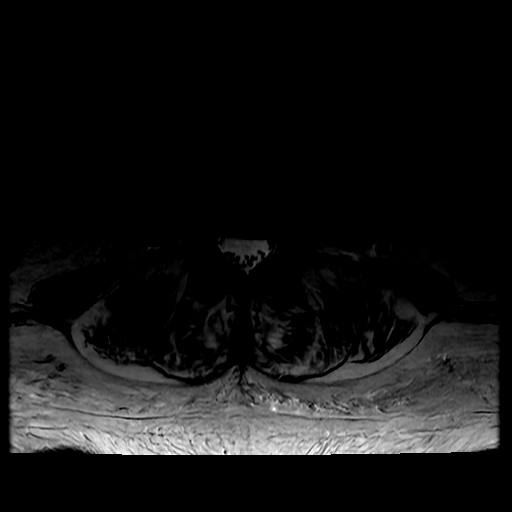
[im 16/30]
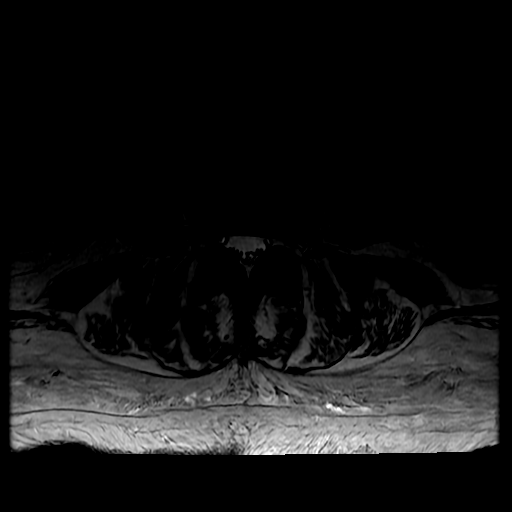
[im 25/30]
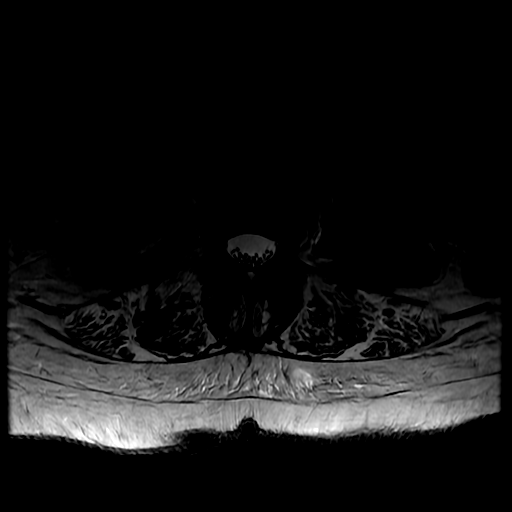

[Series 19: T1 · axial · 4.0mm · 0.39mm/px · z∈[-439,-295]mm · 3 of 30 slices shown (2 of 2)]
[im 5/30]
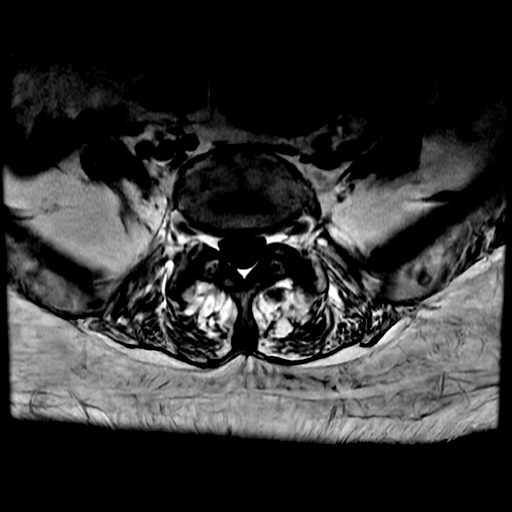
[im 16/30]
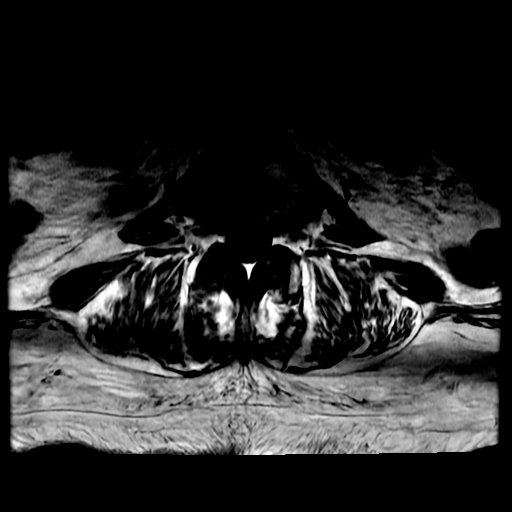
[im 25/30]
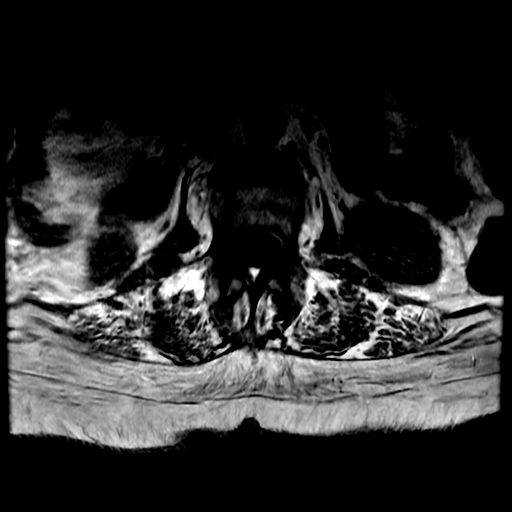

[18 of 48 positions shown; findings below may reference images not displayed]

FINDINGS: Segmentation: Normal, concordant with the thoracic spine numbering
today.

Alignment: Stable lumbar lordosis since last year. Subtle
anterolisthesis of L4 on L5.

Vertebrae: Mild chronic anterior T1 vertebral body compression is
stable from last year. Preserved lumbar vertebral height elsewhere.
No marrow edema or evidence of acute osseous abnormality. Generally
normal background bone marrow signal. There is mild T1 and T2
homogeneous shading of the S3 vertebral body (series 17, image 8),
which has a benign appearance, and appeared negative by CT last
year. This might be focal fibrous dysplasia.

Conus medullaris and cauda equina: Conus extends to the T12-L1
level. No lower spinal cord or conus signal abnormality. Normal
cauda equina nerve roots.

Paraspinal and other soft tissues: Bulky retained stool in the
visible rectosigmoid colon. Otherwise negative.

Disc levels:

L1-L2:  Negative.

L2-L3: Mild far lateral disc bulging, posterior element hypertrophy.
No stenosis.

L3-L4: Left foraminal and far lateral broad-based disc protrusion
with annular fissure on series 18, image 6. Mild facet and ligament
flavum hypertrophy. No spinal or lateral recess stenosis. Mild
involvement of the exiting left L3 nerve.

L4-L5: Subtle anterolisthesis. Mild circumferential disc bulge.
Small left foraminal annular fissure on series 18 image 21. Up to
moderate facet and ligament flavum hypertrophy. Borderline to mild
spinal stenosis. No convincing lateral recess stenosis. Mild left L4
neural foraminal stenosis.

L5-S1: Negative disc. Mild to moderate facet hypertrophy. No
stenosis.
IMPRESSION: 1. Subtle anterolisthesis at L4-L5 with mild disc and moderate
posterior element degeneration. Up to mild multifactorial spinal
stenosis. Mild left L4 foraminal stenosis.

2. Broad-based leftward disc herniation at L3-L4 with annular
fissure mildly affects the exiting left L3 nerve.

3. Mild for age lumbar spine degeneration elsewhere. Mild chronic
anterior L1 compression.

4. Rectosigmoid colon retained stool.  Consider fecal impaction.

## 2021-03-18 IMAGING — MR MR CERVICAL SPINE W/O CM
4 of 5 series · 18 of 48 positions shown · non-contrast
Comparison: Brain MRI [DATE]. Outside cervical spine MRI
[DATE].

CLINICAL DATA: 70-year-old female with bilateral lower extremity
weakness. Unsteady gait. Frequent falls. Bilateral hand tremor.
Spondylosis, radiculopathy.

EXAM:
MRI CERVICAL SPINE WITHOUT CONTRAST
TECHNIQUE: Multiplanar, multisequence MR imaging of the cervical spine was
performed. No intravenous contrast was administered.

[Series 2: T2 · sagittal · 3.0mm · 0.43mm/px · 6 of 16 slices shown (1 of 2)]
[im 1/16]
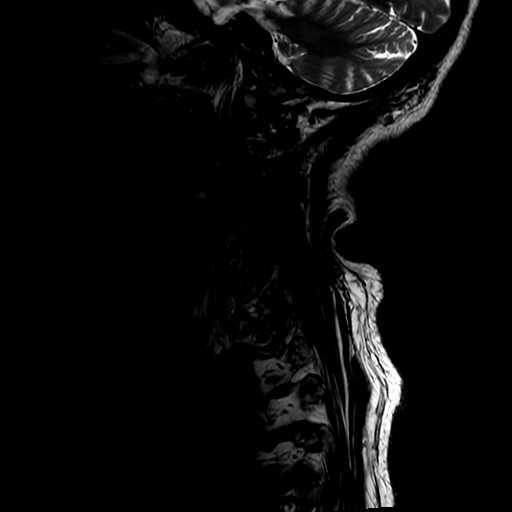
[im 4/16]
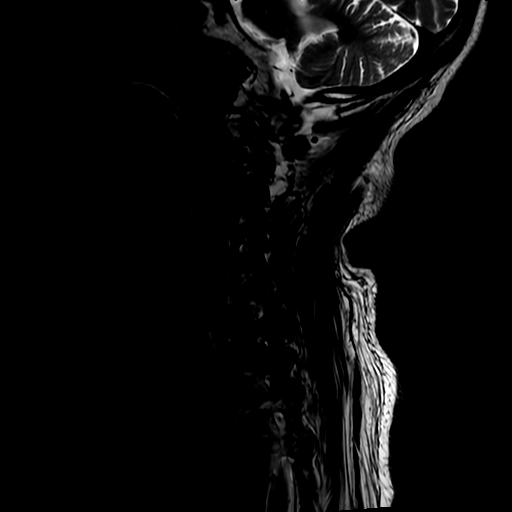
[im 7/16]
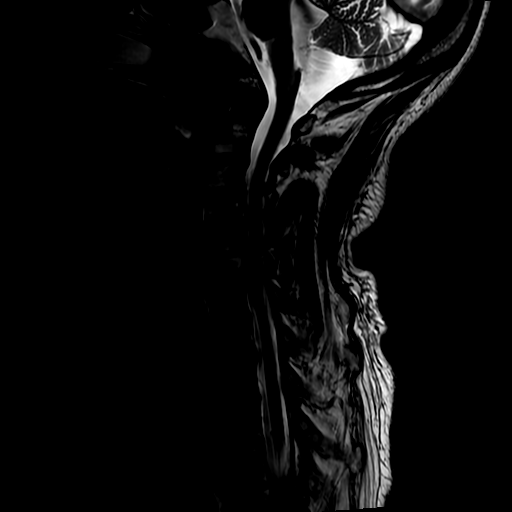
[im 10/16]
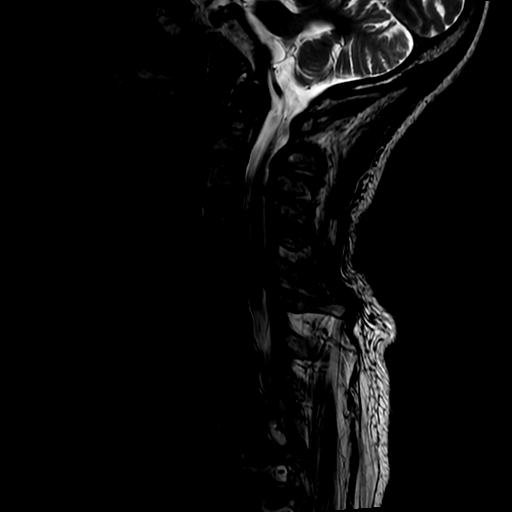
[im 13/16]
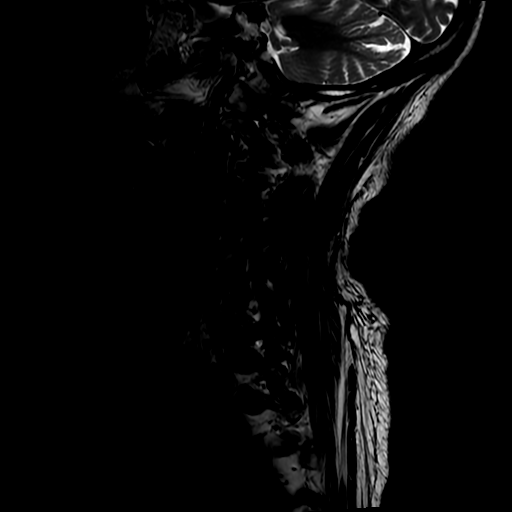
[im 16/16]
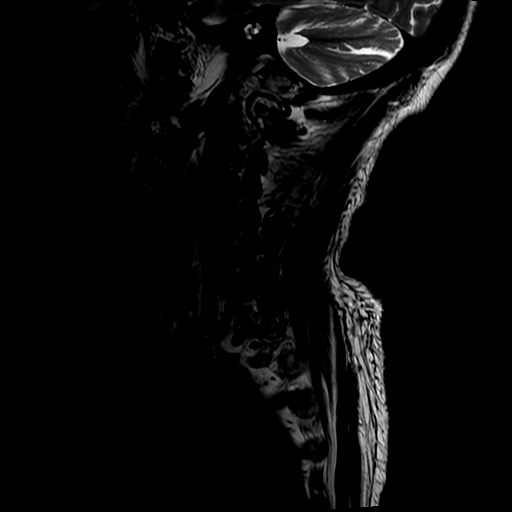

[Series 3: FLAIR · sagittal · 3.0mm · 0.43mm/px · 3 of 16 slices shown]
[im 1/16]
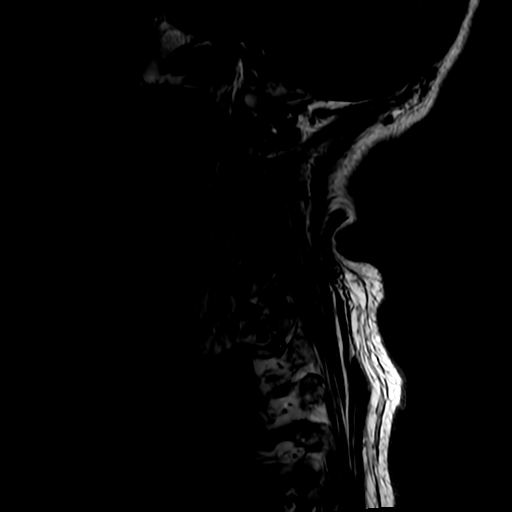
[im 8/16]
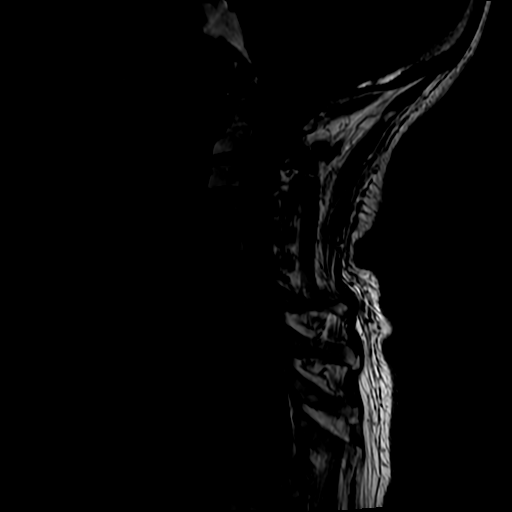
[im 16/16]
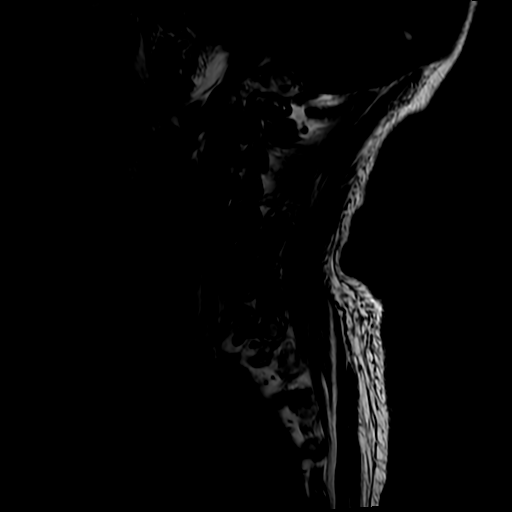

[Series 4: STIR · sagittal · 3.0mm · 0.43mm/px · 3 of 16 slices shown]
[im 1/16]
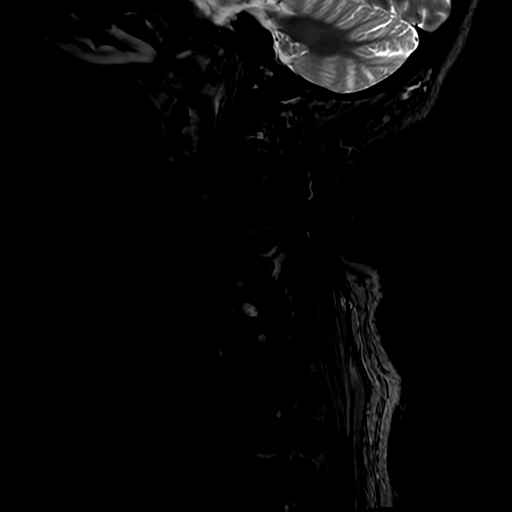
[im 8/16]
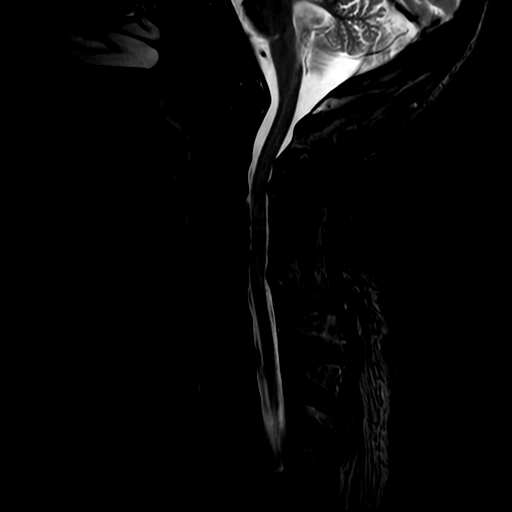
[im 16/16]
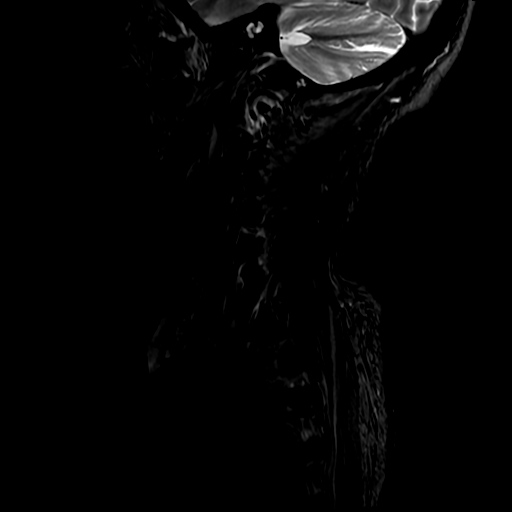

[Series 6: T2 · axial · 3.0mm · 0.35mm/px · z∈[-88,-18]mm · 6 of 25 slices shown (2 of 2)]
[im 1/25]
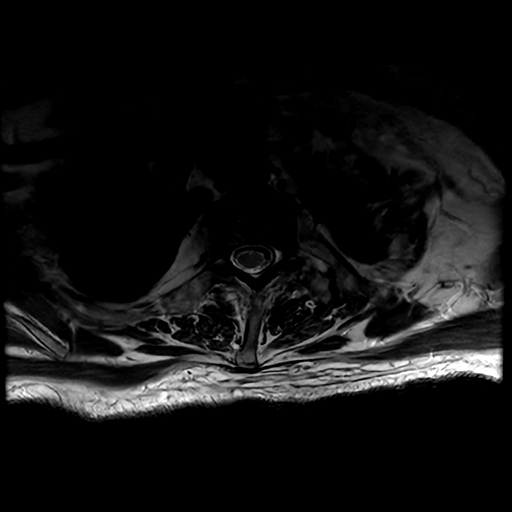
[im 4/25]
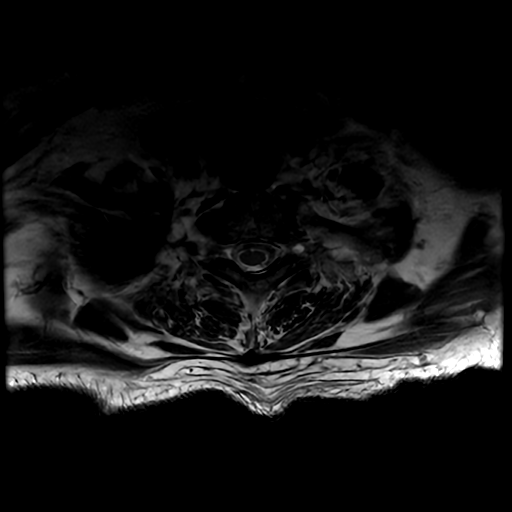
[im 7/25]
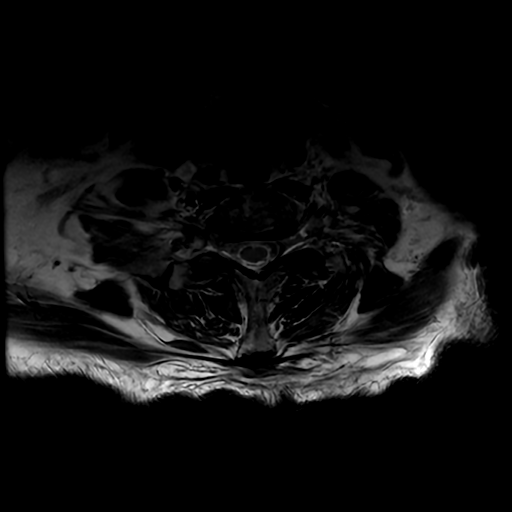
[im 11/25]
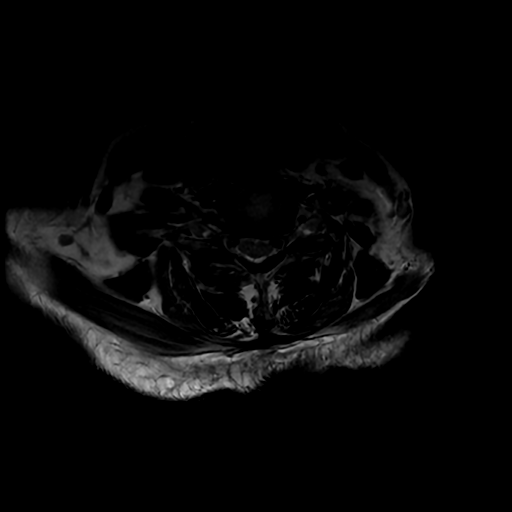
[im 14/25]
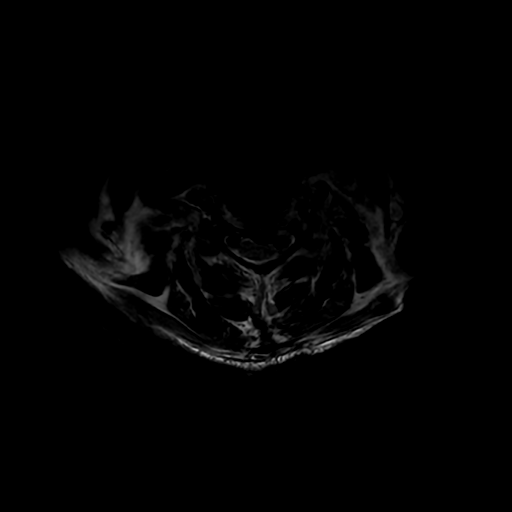
[im 21/25]
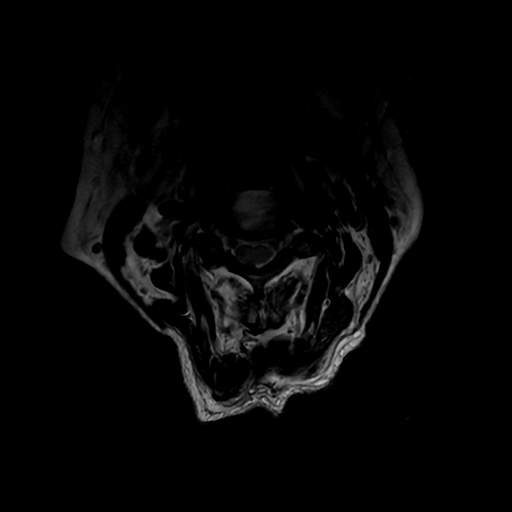

[18 of 48 positions shown; findings below may reference images not displayed]

FINDINGS: Alignment: Stable mild straightening of cervical lordosis from last
year. No spondylolisthesis.

Vertebrae: No marrow edema or evidence of acute osseous abnormality.
Background bone marrow signal within normal limits.

Cord: No cervical spinal cord signal abnormality despite some
degenerative cord mass effect, maximal at C5-C6, detailed below.

Posterior Fossa, vertebral arteries, paraspinal tissues:
Cervicomedullary junction is within normal limits. Negative visible
posterior fossa. Preserved major vascular flow voids in the neck.
The left vertebral artery might be dominant. Negative visible neck
soft tissues and lung apices.

Disc levels:

C2-C3: Right foraminal disc osteophyte complex with mild facet and
ligament flavum hypertrophy. Stable mild right C3 foraminal
stenosis.

C3-C4: Broad-based mild posterior disc bulging appears improved
since last year. Stable mild facet hypertrophy. No significant
stenosis.

C4-C5: Chronic disc space loss with circumferential disc osteophyte
complex, mild facet and ligament flavum hypertrophy. Borderline to
mild spinal stenosis. No convincing foraminal stenosis. This level
is stable.

C5-C6: Disc space loss with chronic circumferential disc osteophyte
complex and moderate size left paracentral component of disc with
some caudal migration (series 5, image 45 and series 6, image 15).
Mild facet and ligament flavum hypertrophy. Mild spinal stenosis and
spinal cord mass effect, especially on the left hemicord. No
convincing foraminal stenosis. This level has not definitely
progressed.

C6-C7: Negative disc but moderate chronic ligament flavum
hypertrophy, mild facet hypertrophy. Borderline to mild spinal
stenosis. No convincing foraminal stenosis.

C7-T1:  Mild facet hypertrophy greater on the right. No stenosis.
IMPRESSION: 1. Chronic disc and endplate degeneration with left paracentral disc
herniation at C5-C6. Subsequent mild spinal stenosis and mild cord
mass effect at that level have not significantly changed from an MRI
last year. No cord signal abnormality.
2. Borderline to mild spinal stenosis at C4-C5 and C6-C7. Mild right
C3 foraminal stenosis.

## 2021-03-18 IMAGING — CR DG KNEE COMPLETE 4+V*L*
4 series · 4 of 4 positions shown · non-contrast
Comparison: None.

CLINICAL DATA: Chronic bilateral knee pain.  No known injury.

EXAM:
LEFT KNEE - COMPLETE 4+ VIEW

[knee ap]
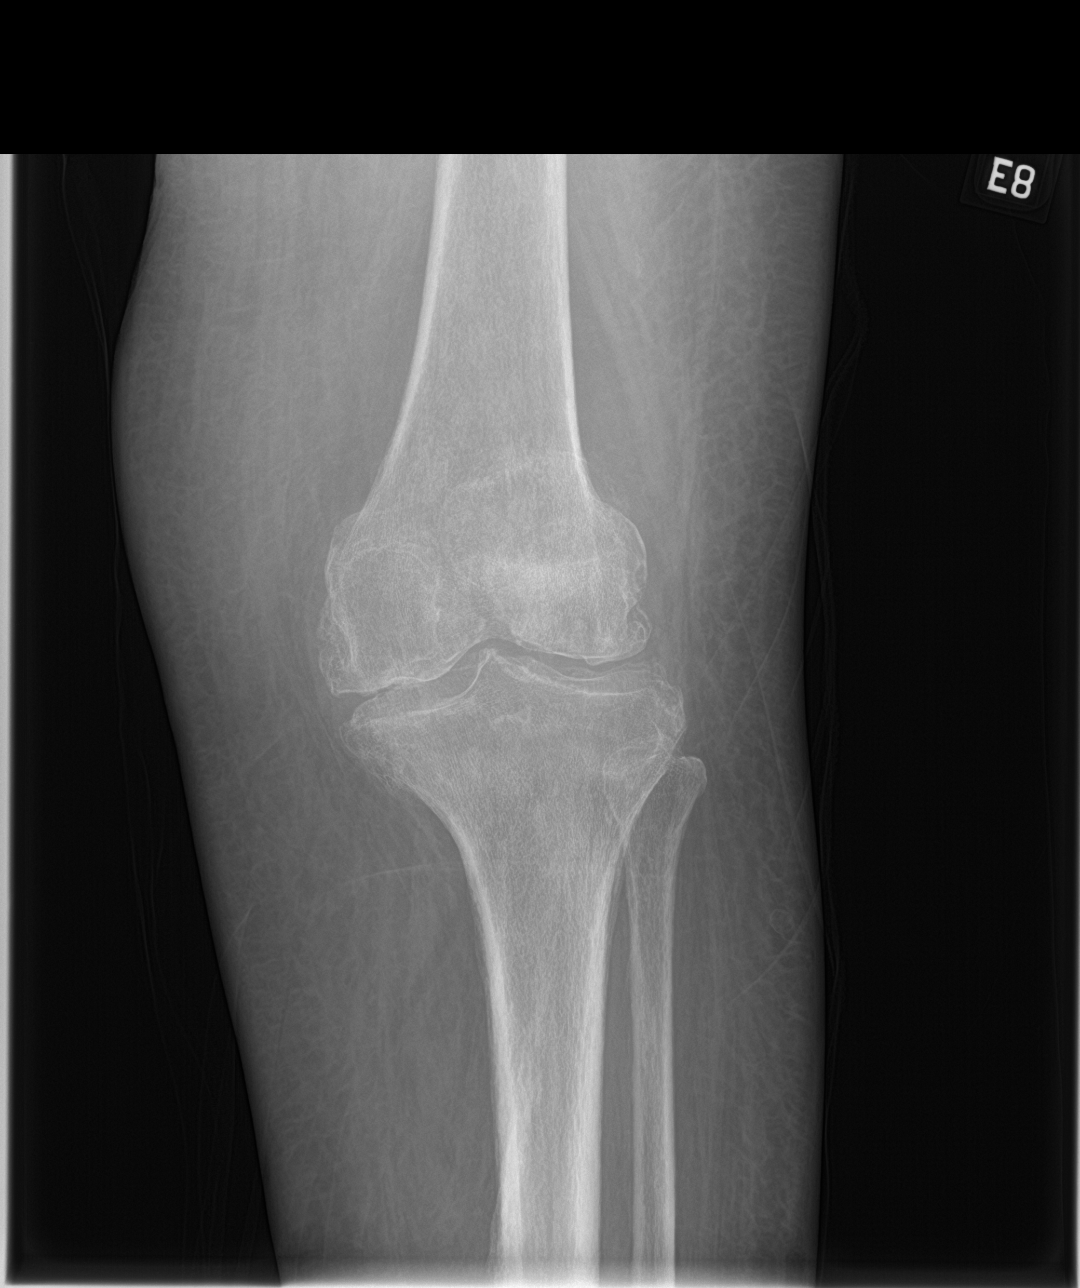

[knee lat]
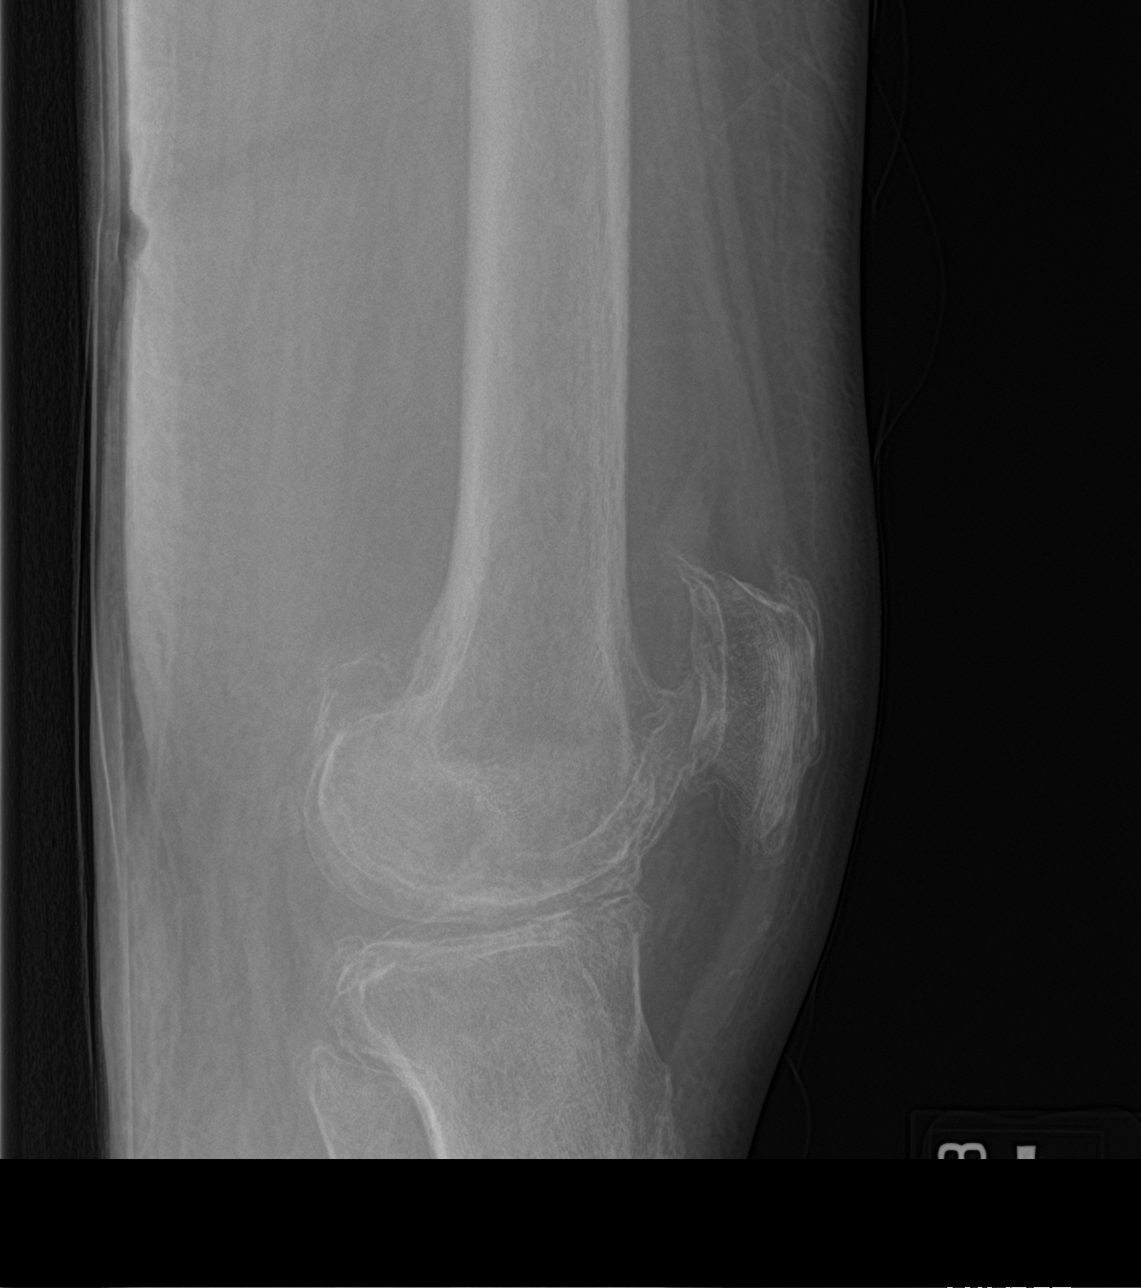

[knee obl (1 of 2)]
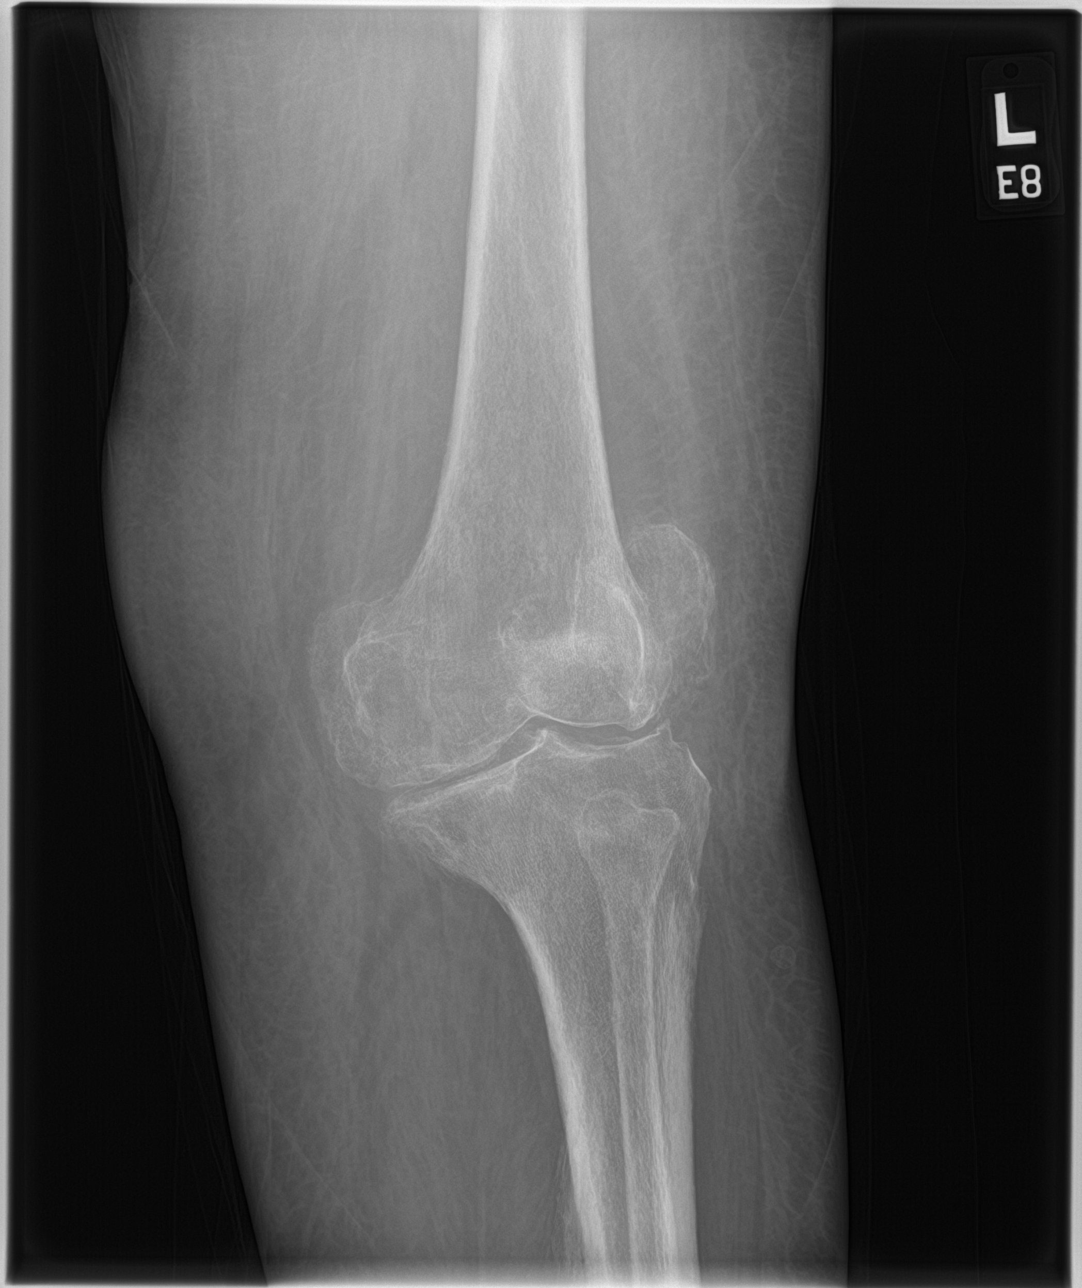

[knee obl (2 of 2)]
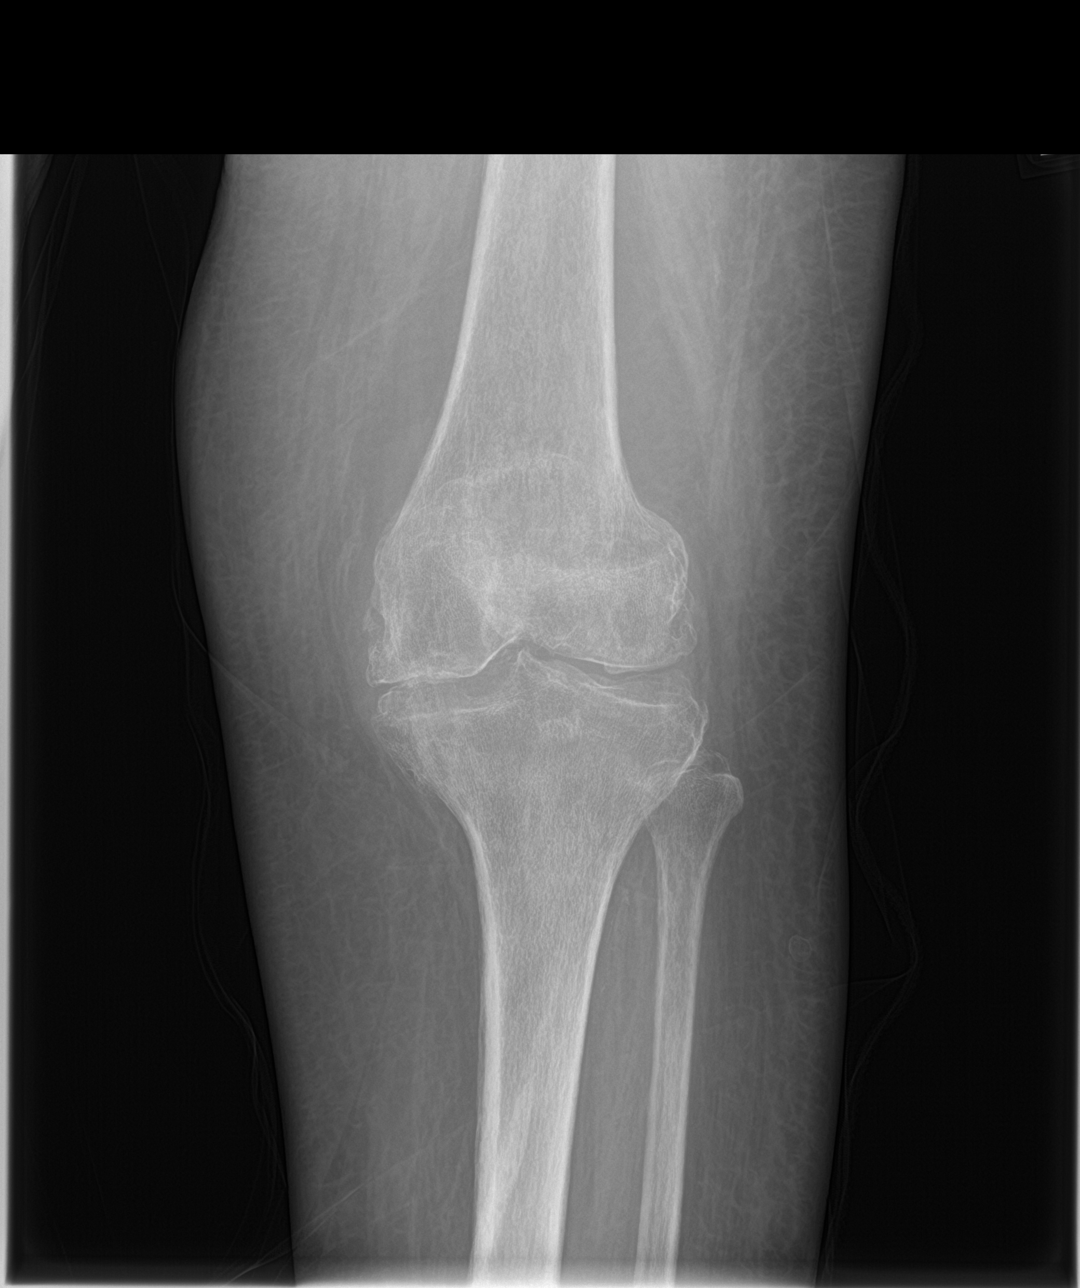

[4 of 4 positions shown; findings below may reference images not displayed]

FINDINGS: No evidence of fracture, dislocation, or joint effusion. Moderate
degenerative changes noted medially. Mild degenerative changes noted
laterally. Moderate patellar spurring is noted. Soft tissues are
unremarkable. Patient could not tolerate sunrise view.
IMPRESSION: Moderate degenerative joint disease.  No acute abnormality seen.

## 2021-03-22 DIAGNOSIS — I10 Essential (primary) hypertension: Secondary | ICD-10-CM | POA: Diagnosis not present

## 2021-03-22 DIAGNOSIS — G40309 Generalized idiopathic epilepsy and epileptic syndromes, not intractable, without status epilepticus: Secondary | ICD-10-CM | POA: Diagnosis not present

## 2021-03-22 DIAGNOSIS — N184 Chronic kidney disease, stage 4 (severe): Secondary | ICD-10-CM | POA: Diagnosis not present

## 2021-03-22 DIAGNOSIS — E039 Hypothyroidism, unspecified: Secondary | ICD-10-CM | POA: Diagnosis not present

## 2021-03-22 DIAGNOSIS — I259 Chronic ischemic heart disease, unspecified: Secondary | ICD-10-CM | POA: Diagnosis not present

## 2021-03-25 DIAGNOSIS — M4722 Other spondylosis with radiculopathy, cervical region: Secondary | ICD-10-CM | POA: Diagnosis not present

## 2021-03-25 DIAGNOSIS — I10 Essential (primary) hypertension: Secondary | ICD-10-CM | POA: Diagnosis not present

## 2021-04-09 DIAGNOSIS — N1832 Chronic kidney disease, stage 3b: Secondary | ICD-10-CM | POA: Diagnosis not present

## 2021-04-09 DIAGNOSIS — N390 Urinary tract infection, site not specified: Secondary | ICD-10-CM | POA: Diagnosis not present

## 2021-04-11 DIAGNOSIS — N1832 Chronic kidney disease, stage 3b: Secondary | ICD-10-CM | POA: Diagnosis not present

## 2021-04-22 DIAGNOSIS — I259 Chronic ischemic heart disease, unspecified: Secondary | ICD-10-CM | POA: Diagnosis not present

## 2021-04-22 DIAGNOSIS — N184 Chronic kidney disease, stage 4 (severe): Secondary | ICD-10-CM | POA: Diagnosis not present

## 2021-04-22 DIAGNOSIS — G40309 Generalized idiopathic epilepsy and epileptic syndromes, not intractable, without status epilepticus: Secondary | ICD-10-CM | POA: Diagnosis not present

## 2021-04-22 DIAGNOSIS — E039 Hypothyroidism, unspecified: Secondary | ICD-10-CM | POA: Diagnosis not present

## 2021-04-22 DIAGNOSIS — I10 Essential (primary) hypertension: Secondary | ICD-10-CM | POA: Diagnosis not present

## 2021-05-09 DIAGNOSIS — N390 Urinary tract infection, site not specified: Secondary | ICD-10-CM | POA: Diagnosis not present

## 2021-05-22 DIAGNOSIS — I259 Chronic ischemic heart disease, unspecified: Secondary | ICD-10-CM | POA: Diagnosis not present

## 2021-05-22 DIAGNOSIS — N184 Chronic kidney disease, stage 4 (severe): Secondary | ICD-10-CM | POA: Diagnosis not present

## 2021-06-11 DIAGNOSIS — N1832 Chronic kidney disease, stage 3b: Secondary | ICD-10-CM | POA: Diagnosis not present

## 2021-06-12 DIAGNOSIS — N1832 Chronic kidney disease, stage 3b: Secondary | ICD-10-CM | POA: Diagnosis not present

## 2021-06-21 DIAGNOSIS — G40309 Generalized idiopathic epilepsy and epileptic syndromes, not intractable, without status epilepticus: Secondary | ICD-10-CM | POA: Diagnosis not present

## 2021-06-21 DIAGNOSIS — E039 Hypothyroidism, unspecified: Secondary | ICD-10-CM | POA: Diagnosis not present

## 2021-06-21 DIAGNOSIS — I10 Essential (primary) hypertension: Secondary | ICD-10-CM | POA: Diagnosis not present

## 2021-06-22 DIAGNOSIS — N184 Chronic kidney disease, stage 4 (severe): Secondary | ICD-10-CM | POA: Diagnosis not present

## 2021-06-22 DIAGNOSIS — I259 Chronic ischemic heart disease, unspecified: Secondary | ICD-10-CM | POA: Diagnosis not present

## 2021-07-02 DIAGNOSIS — R2689 Other abnormalities of gait and mobility: Secondary | ICD-10-CM | POA: Diagnosis not present

## 2021-07-02 DIAGNOSIS — Z79899 Other long term (current) drug therapy: Secondary | ICD-10-CM | POA: Diagnosis not present

## 2021-07-02 DIAGNOSIS — E039 Hypothyroidism, unspecified: Secondary | ICD-10-CM | POA: Diagnosis not present

## 2021-07-02 DIAGNOSIS — Z23 Encounter for immunization: Secondary | ICD-10-CM | POA: Diagnosis not present

## 2021-07-23 DIAGNOSIS — I10 Essential (primary) hypertension: Secondary | ICD-10-CM | POA: Diagnosis not present

## 2021-07-23 DIAGNOSIS — I259 Chronic ischemic heart disease, unspecified: Secondary | ICD-10-CM | POA: Diagnosis not present

## 2021-07-23 DIAGNOSIS — N184 Chronic kidney disease, stage 4 (severe): Secondary | ICD-10-CM | POA: Diagnosis not present

## 2021-07-23 DIAGNOSIS — G40309 Generalized idiopathic epilepsy and epileptic syndromes, not intractable, without status epilepticus: Secondary | ICD-10-CM | POA: Diagnosis not present

## 2021-07-23 DIAGNOSIS — E039 Hypothyroidism, unspecified: Secondary | ICD-10-CM | POA: Diagnosis not present

## 2021-08-13 DIAGNOSIS — N1832 Chronic kidney disease, stage 3b: Secondary | ICD-10-CM | POA: Diagnosis not present

## 2021-08-20 DIAGNOSIS — I259 Chronic ischemic heart disease, unspecified: Secondary | ICD-10-CM | POA: Diagnosis not present

## 2021-08-20 DIAGNOSIS — I10 Essential (primary) hypertension: Secondary | ICD-10-CM | POA: Diagnosis not present

## 2021-08-20 DIAGNOSIS — N184 Chronic kidney disease, stage 4 (severe): Secondary | ICD-10-CM | POA: Diagnosis not present

## 2021-08-20 DIAGNOSIS — E039 Hypothyroidism, unspecified: Secondary | ICD-10-CM | POA: Diagnosis not present

## 2021-08-20 DIAGNOSIS — G40309 Generalized idiopathic epilepsy and epileptic syndromes, not intractable, without status epilepticus: Secondary | ICD-10-CM | POA: Diagnosis not present

## 2021-08-22 DIAGNOSIS — Z20822 Contact with and (suspected) exposure to covid-19: Secondary | ICD-10-CM | POA: Diagnosis not present

## 2021-09-18 DIAGNOSIS — M6281 Muscle weakness (generalized): Secondary | ICD-10-CM | POA: Diagnosis not present

## 2021-10-10 ENCOUNTER — Telehealth: Payer: Self-pay | Admitting: Oncology

## 2021-10-10 DIAGNOSIS — Z20822 Contact with and (suspected) exposure to covid-19: Secondary | ICD-10-CM | POA: Diagnosis not present

## 2021-10-10 NOTE — Progress Notes (Signed)
?[facility]  ?[address] ?[place],  [place]  [address] ?[phone] ? ?Clinic Day:  10/11/2021 ? ?Referring physician: Ronita Hipps, MD ? ? ?HISTORY OF PRESENT ILLNESS:  ?The patient is a 71 y.o. 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 The patient also underwent adjuvant breast radiation to prevent local disease recurrence.  She also took anastrozole for 5 years for her adjuvant endocrine management.  She comes in today for routine follow-up.  Since her last visit, the patient has been doing fairly well.  As it pertains to her breast cancer, she continues to deny having any breast changes which concern her for disease recurrence.  Of note, the patient did attempt to undergo her annual mammogram a few weeks ago, but due to being in a wheelchair and having limited mobility, this study could not be done.  Per hospital records, she has not had a mammogram since July 2020. ? ? ?PHYSICAL EXAM:  ?Blood pressure (!) 153/67, pulse 86, temperature 98 ?F (36.7 ?C), resp. rate 16, height '5\' 3"'$  (1.6 m), SpO2 100 %. ?Wt Readings from Last 3 Encounters:  ?10/12/20 140 lb (63.5 kg)  ?06/06/19 236 lb (107 kg)  ? ?Body mass index is 24.8 kg/m?Marland Kitchen ?Performance status (ECOG): 3 ?Physical Exam ?Constitutional:   ?   Appearance: She is ill-appearing.  ?   Comments: Chronically ill-appearing woman in a wheelchair.  ?HENT:  ?   Mouth/Throat:  ?   Pharynx: Oropharynx is clear. No oropharyngeal exudate.  ?Cardiovascular:  ?   Rate and Rhythm: Normal rate and regular rhythm.  ?   Heart sounds: No murmur heard. ?  No friction rub. No gallop.  ?Pulmonary:  ?   Breath sounds: Normal breath sounds.  ?Chest:  ?Breasts: ?   Right: Normal. No swelling, bleeding, inverted nipple, mass, nipple discharge or skin change.  ?   Left: Normal. No swelling, bleeding, inverted nipple, mass, nipple discharge or skin change.  ?Abdominal:  ?   General: Bowel  sounds are normal. There is no distension.  ?   Palpations: Abdomen is soft. There is no mass.  ?   Tenderness: There is no abdominal tenderness.  ?Musculoskeletal:     ?   General: No tenderness.  ?   Cervical back: Normal range of motion and neck supple.  ?   Right lower leg: No edema.  ?   Left lower leg: No edema.  ?Lymphadenopathy:  ?   Cervical: No cervical adenopathy.  ?   Right cervical: No superficial, deep or posterior cervical adenopathy. ?   Left cervical: No superficial, deep or posterior cervical adenopathy.  ?   Upper Body:  ?   Right upper body: No supraclavicular or axillary adenopathy.  ?   Left upper body: No supraclavicular or axillary adenopathy.  ?   Lower Body: No right inguinal adenopathy. No left inguinal adenopathy.  ?Skin: ?   Coloration: Skin is not jaundiced.  ?   Findings: No lesion or rash.  ?Neurological:  ?   General: No focal deficit present.  ?   Mental Status: She is alert and oriented to person, place, and time. Mental status is at baseline.  ?Psychiatric:     ?   Mood and Affect: Mood normal.     ?   Behavior: Behavior normal.     ?   Thought Content: Thought content normal.     ?  Judgment: Judgment normal.  ? ?ASSESSMENT & PLAN:  ?Assessment/Plan:  A 71 y.o. 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  Based upon her clinical breast exam today, the patient remains disease free.  Our office will try to get her set up for mammogram in [place], where they apparently have a mammogram machine that would be able to accommodate her physical limitations.  This will be set up in the forthcoming weeks.  Otherwise, as she is doing well from a breast cancer perspective, I will see her back in 1 year for repeat clinical assessment. The patient understands all the plans discussed today and is in agreement with them.   ? ?Edith Lord Macarthur Critchley, MD   ? ? ?  ?

## 2021-10-10 NOTE — Telephone Encounter (Signed)
10/10/21 spoke with daughter(Tammy)and rescheduled patient on 10/11/21'@130pm'$  ?

## 2021-10-11 ENCOUNTER — Inpatient Hospital Stay: Payer: Medicare Other | Attending: Oncology | Admitting: Oncology

## 2021-10-11 ENCOUNTER — Ambulatory Visit: Payer: Medicare Other | Admitting: Oncology

## 2021-10-11 ENCOUNTER — Other Ambulatory Visit: Payer: Self-pay

## 2021-10-11 VITALS — BP 153/67 | HR 86 | Temp 98.0°F | Resp 16 | Ht 63.0 in

## 2021-10-11 DIAGNOSIS — C50111 Malignant neoplasm of central portion of right female breast: Secondary | ICD-10-CM

## 2021-10-11 DIAGNOSIS — Z170 Estrogen receptor positive status [ER+]: Secondary | ICD-10-CM

## 2021-10-17 ENCOUNTER — Ambulatory Visit: Payer: Medicare Other | Admitting: Oncology

## 2021-10-18 ENCOUNTER — Ambulatory Visit: Payer: Medicare Other | Admitting: Oncology

## 2021-10-21 DIAGNOSIS — Z20822 Contact with and (suspected) exposure to covid-19: Secondary | ICD-10-CM | POA: Diagnosis not present

## 2022-01-15 DIAGNOSIS — Z1331 Encounter for screening for depression: Secondary | ICD-10-CM | POA: Diagnosis not present

## 2022-01-15 DIAGNOSIS — Z6826 Body mass index (BMI) 26.0-26.9, adult: Secondary | ICD-10-CM | POA: Diagnosis not present

## 2022-01-15 DIAGNOSIS — E039 Hypothyroidism, unspecified: Secondary | ICD-10-CM | POA: Diagnosis not present

## 2022-01-15 DIAGNOSIS — Z79899 Other long term (current) drug therapy: Secondary | ICD-10-CM | POA: Diagnosis not present

## 2022-01-15 DIAGNOSIS — Z0000 Encounter for general adult medical examination without abnormal findings: Secondary | ICD-10-CM | POA: Diagnosis not present

## 2022-02-26 DIAGNOSIS — R531 Weakness: Secondary | ICD-10-CM | POA: Diagnosis not present

## 2022-02-26 DIAGNOSIS — Z1159 Encounter for screening for other viral diseases: Secondary | ICD-10-CM | POA: Diagnosis not present

## 2022-02-26 DIAGNOSIS — R799 Abnormal finding of blood chemistry, unspecified: Secondary | ICD-10-CM | POA: Diagnosis not present

## 2022-02-26 DIAGNOSIS — Z113 Encounter for screening for infections with a predominantly sexual mode of transmission: Secondary | ICD-10-CM | POA: Diagnosis not present

## 2022-03-07 DIAGNOSIS — I10 Essential (primary) hypertension: Secondary | ICD-10-CM | POA: Diagnosis not present

## 2022-03-17 DIAGNOSIS — M6281 Muscle weakness (generalized): Secondary | ICD-10-CM | POA: Diagnosis not present

## 2022-03-27 DIAGNOSIS — G5621 Lesion of ulnar nerve, right upper limb: Secondary | ICD-10-CM | POA: Diagnosis not present

## 2022-03-27 DIAGNOSIS — G5601 Carpal tunnel syndrome, right upper limb: Secondary | ICD-10-CM | POA: Diagnosis not present

## 2022-03-27 DIAGNOSIS — G729 Myopathy, unspecified: Secondary | ICD-10-CM | POA: Diagnosis not present

## 2022-03-27 DIAGNOSIS — R531 Weakness: Secondary | ICD-10-CM | POA: Diagnosis not present

## 2022-03-27 DIAGNOSIS — G629 Polyneuropathy, unspecified: Secondary | ICD-10-CM | POA: Diagnosis not present

## 2022-04-22 DIAGNOSIS — G40309 Generalized idiopathic epilepsy and epileptic syndromes, not intractable, without status epilepticus: Secondary | ICD-10-CM | POA: Diagnosis not present

## 2022-04-22 DIAGNOSIS — I10 Essential (primary) hypertension: Secondary | ICD-10-CM | POA: Diagnosis not present

## 2022-04-22 DIAGNOSIS — E039 Hypothyroidism, unspecified: Secondary | ICD-10-CM | POA: Diagnosis not present

## 2022-05-09 DIAGNOSIS — G729 Myopathy, unspecified: Secondary | ICD-10-CM | POA: Diagnosis not present

## 2022-05-09 DIAGNOSIS — M62552 Muscle wasting and atrophy, not elsewhere classified, left thigh: Secondary | ICD-10-CM | POA: Diagnosis not present

## 2022-05-09 DIAGNOSIS — R531 Weakness: Secondary | ICD-10-CM | POA: Diagnosis not present

## 2022-05-09 DIAGNOSIS — R600 Localized edema: Secondary | ICD-10-CM | POA: Diagnosis not present

## 2022-07-08 DIAGNOSIS — E639 Nutritional deficiency, unspecified: Secondary | ICD-10-CM | POA: Diagnosis not present

## 2022-07-08 DIAGNOSIS — R531 Weakness: Secondary | ICD-10-CM | POA: Diagnosis not present

## 2022-07-08 DIAGNOSIS — G63 Polyneuropathy in diseases classified elsewhere: Secondary | ICD-10-CM | POA: Diagnosis not present

## 2022-07-22 DIAGNOSIS — M4803 Spinal stenosis, cervicothoracic region: Secondary | ICD-10-CM | POA: Diagnosis not present

## 2022-07-22 DIAGNOSIS — M47816 Spondylosis without myelopathy or radiculopathy, lumbar region: Secondary | ICD-10-CM | POA: Diagnosis not present

## 2022-07-22 DIAGNOSIS — Z993 Dependence on wheelchair: Secondary | ICD-10-CM | POA: Diagnosis not present

## 2022-07-22 DIAGNOSIS — R296 Repeated falls: Secondary | ICD-10-CM | POA: Diagnosis not present

## 2022-07-22 DIAGNOSIS — Z853 Personal history of malignant neoplasm of breast: Secondary | ICD-10-CM | POA: Diagnosis not present

## 2022-07-22 DIAGNOSIS — M48061 Spinal stenosis, lumbar region without neurogenic claudication: Secondary | ICD-10-CM | POA: Diagnosis not present

## 2022-07-22 DIAGNOSIS — G63 Polyneuropathy in diseases classified elsewhere: Secondary | ICD-10-CM | POA: Diagnosis not present

## 2022-07-22 DIAGNOSIS — Z9181 History of falling: Secondary | ICD-10-CM | POA: Diagnosis not present

## 2022-07-22 DIAGNOSIS — G7281 Critical illness myopathy: Secondary | ICD-10-CM | POA: Diagnosis not present

## 2022-07-22 DIAGNOSIS — Z6837 Body mass index (BMI) 37.0-37.9, adult: Secondary | ICD-10-CM | POA: Diagnosis not present

## 2022-07-22 DIAGNOSIS — G40909 Epilepsy, unspecified, not intractable, without status epilepticus: Secondary | ICD-10-CM | POA: Diagnosis not present

## 2022-07-22 DIAGNOSIS — N189 Chronic kidney disease, unspecified: Secondary | ICD-10-CM | POA: Diagnosis not present

## 2022-07-22 DIAGNOSIS — R634 Abnormal weight loss: Secondary | ICD-10-CM | POA: Diagnosis not present

## 2022-07-22 DIAGNOSIS — M8448XD Pathological fracture, other site, subsequent encounter for fracture with routine healing: Secondary | ICD-10-CM | POA: Diagnosis not present

## 2022-07-22 DIAGNOSIS — E531 Pyridoxine deficiency: Secondary | ICD-10-CM | POA: Diagnosis not present

## 2022-07-22 DIAGNOSIS — G5601 Carpal tunnel syndrome, right upper limb: Secondary | ICD-10-CM | POA: Diagnosis not present

## 2022-07-23 DIAGNOSIS — M47816 Spondylosis without myelopathy or radiculopathy, lumbar region: Secondary | ICD-10-CM | POA: Diagnosis not present

## 2022-07-23 DIAGNOSIS — M48061 Spinal stenosis, lumbar region without neurogenic claudication: Secondary | ICD-10-CM | POA: Diagnosis not present

## 2022-07-23 DIAGNOSIS — G40909 Epilepsy, unspecified, not intractable, without status epilepticus: Secondary | ICD-10-CM | POA: Diagnosis not present

## 2022-07-23 DIAGNOSIS — G63 Polyneuropathy in diseases classified elsewhere: Secondary | ICD-10-CM | POA: Diagnosis not present

## 2022-07-23 DIAGNOSIS — G7281 Critical illness myopathy: Secondary | ICD-10-CM | POA: Diagnosis not present

## 2022-07-23 DIAGNOSIS — E531 Pyridoxine deficiency: Secondary | ICD-10-CM | POA: Diagnosis not present

## 2022-07-25 DIAGNOSIS — E531 Pyridoxine deficiency: Secondary | ICD-10-CM | POA: Diagnosis not present

## 2022-07-25 DIAGNOSIS — M48061 Spinal stenosis, lumbar region without neurogenic claudication: Secondary | ICD-10-CM | POA: Diagnosis not present

## 2022-07-25 DIAGNOSIS — G40909 Epilepsy, unspecified, not intractable, without status epilepticus: Secondary | ICD-10-CM | POA: Diagnosis not present

## 2022-07-25 DIAGNOSIS — M47816 Spondylosis without myelopathy or radiculopathy, lumbar region: Secondary | ICD-10-CM | POA: Diagnosis not present

## 2022-07-25 DIAGNOSIS — G63 Polyneuropathy in diseases classified elsewhere: Secondary | ICD-10-CM | POA: Diagnosis not present

## 2022-07-25 DIAGNOSIS — G7281 Critical illness myopathy: Secondary | ICD-10-CM | POA: Diagnosis not present

## 2022-07-28 DIAGNOSIS — G7281 Critical illness myopathy: Secondary | ICD-10-CM | POA: Diagnosis not present

## 2022-07-28 DIAGNOSIS — G40909 Epilepsy, unspecified, not intractable, without status epilepticus: Secondary | ICD-10-CM | POA: Diagnosis not present

## 2022-07-28 DIAGNOSIS — E531 Pyridoxine deficiency: Secondary | ICD-10-CM | POA: Diagnosis not present

## 2022-07-28 DIAGNOSIS — M47816 Spondylosis without myelopathy or radiculopathy, lumbar region: Secondary | ICD-10-CM | POA: Diagnosis not present

## 2022-07-28 DIAGNOSIS — M48061 Spinal stenosis, lumbar region without neurogenic claudication: Secondary | ICD-10-CM | POA: Diagnosis not present

## 2022-07-28 DIAGNOSIS — G63 Polyneuropathy in diseases classified elsewhere: Secondary | ICD-10-CM | POA: Diagnosis not present

## 2022-07-29 DIAGNOSIS — M47816 Spondylosis without myelopathy or radiculopathy, lumbar region: Secondary | ICD-10-CM | POA: Diagnosis not present

## 2022-07-29 DIAGNOSIS — G7281 Critical illness myopathy: Secondary | ICD-10-CM | POA: Diagnosis not present

## 2022-07-29 DIAGNOSIS — M48061 Spinal stenosis, lumbar region without neurogenic claudication: Secondary | ICD-10-CM | POA: Diagnosis not present

## 2022-07-29 DIAGNOSIS — E531 Pyridoxine deficiency: Secondary | ICD-10-CM | POA: Diagnosis not present

## 2022-07-29 DIAGNOSIS — G63 Polyneuropathy in diseases classified elsewhere: Secondary | ICD-10-CM | POA: Diagnosis not present

## 2022-07-29 DIAGNOSIS — G40909 Epilepsy, unspecified, not intractable, without status epilepticus: Secondary | ICD-10-CM | POA: Diagnosis not present

## 2022-07-31 DIAGNOSIS — G7281 Critical illness myopathy: Secondary | ICD-10-CM | POA: Diagnosis not present

## 2022-07-31 DIAGNOSIS — M48061 Spinal stenosis, lumbar region without neurogenic claudication: Secondary | ICD-10-CM | POA: Diagnosis not present

## 2022-07-31 DIAGNOSIS — G63 Polyneuropathy in diseases classified elsewhere: Secondary | ICD-10-CM | POA: Diagnosis not present

## 2022-07-31 DIAGNOSIS — M47816 Spondylosis without myelopathy or radiculopathy, lumbar region: Secondary | ICD-10-CM | POA: Diagnosis not present

## 2022-07-31 DIAGNOSIS — E531 Pyridoxine deficiency: Secondary | ICD-10-CM | POA: Diagnosis not present

## 2022-07-31 DIAGNOSIS — G40909 Epilepsy, unspecified, not intractable, without status epilepticus: Secondary | ICD-10-CM | POA: Diagnosis not present

## 2022-08-05 DIAGNOSIS — G63 Polyneuropathy in diseases classified elsewhere: Secondary | ICD-10-CM | POA: Diagnosis not present

## 2022-08-05 DIAGNOSIS — G40909 Epilepsy, unspecified, not intractable, without status epilepticus: Secondary | ICD-10-CM | POA: Diagnosis not present

## 2022-08-05 DIAGNOSIS — G7281 Critical illness myopathy: Secondary | ICD-10-CM | POA: Diagnosis not present

## 2022-08-05 DIAGNOSIS — M48061 Spinal stenosis, lumbar region without neurogenic claudication: Secondary | ICD-10-CM | POA: Diagnosis not present

## 2022-08-05 DIAGNOSIS — M47816 Spondylosis without myelopathy or radiculopathy, lumbar region: Secondary | ICD-10-CM | POA: Diagnosis not present

## 2022-08-05 DIAGNOSIS — E531 Pyridoxine deficiency: Secondary | ICD-10-CM | POA: Diagnosis not present

## 2022-08-07 DIAGNOSIS — E531 Pyridoxine deficiency: Secondary | ICD-10-CM | POA: Diagnosis not present

## 2022-08-07 DIAGNOSIS — M47816 Spondylosis without myelopathy or radiculopathy, lumbar region: Secondary | ICD-10-CM | POA: Diagnosis not present

## 2022-08-07 DIAGNOSIS — M48061 Spinal stenosis, lumbar region without neurogenic claudication: Secondary | ICD-10-CM | POA: Diagnosis not present

## 2022-08-07 DIAGNOSIS — G63 Polyneuropathy in diseases classified elsewhere: Secondary | ICD-10-CM | POA: Diagnosis not present

## 2022-08-07 DIAGNOSIS — G40909 Epilepsy, unspecified, not intractable, without status epilepticus: Secondary | ICD-10-CM | POA: Diagnosis not present

## 2022-08-07 DIAGNOSIS — G7281 Critical illness myopathy: Secondary | ICD-10-CM | POA: Diagnosis not present

## 2022-08-12 DIAGNOSIS — G40909 Epilepsy, unspecified, not intractable, without status epilepticus: Secondary | ICD-10-CM | POA: Diagnosis not present

## 2022-08-12 DIAGNOSIS — M47816 Spondylosis without myelopathy or radiculopathy, lumbar region: Secondary | ICD-10-CM | POA: Diagnosis not present

## 2022-08-12 DIAGNOSIS — G63 Polyneuropathy in diseases classified elsewhere: Secondary | ICD-10-CM | POA: Diagnosis not present

## 2022-08-12 DIAGNOSIS — M48061 Spinal stenosis, lumbar region without neurogenic claudication: Secondary | ICD-10-CM | POA: Diagnosis not present

## 2022-08-12 DIAGNOSIS — G7281 Critical illness myopathy: Secondary | ICD-10-CM | POA: Diagnosis not present

## 2022-08-12 DIAGNOSIS — E531 Pyridoxine deficiency: Secondary | ICD-10-CM | POA: Diagnosis not present

## 2022-08-14 DIAGNOSIS — G40909 Epilepsy, unspecified, not intractable, without status epilepticus: Secondary | ICD-10-CM | POA: Diagnosis not present

## 2022-08-14 DIAGNOSIS — G7281 Critical illness myopathy: Secondary | ICD-10-CM | POA: Diagnosis not present

## 2022-08-14 DIAGNOSIS — M48061 Spinal stenosis, lumbar region without neurogenic claudication: Secondary | ICD-10-CM | POA: Diagnosis not present

## 2022-08-14 DIAGNOSIS — G63 Polyneuropathy in diseases classified elsewhere: Secondary | ICD-10-CM | POA: Diagnosis not present

## 2022-08-14 DIAGNOSIS — M47816 Spondylosis without myelopathy or radiculopathy, lumbar region: Secondary | ICD-10-CM | POA: Diagnosis not present

## 2022-08-14 DIAGNOSIS — E531 Pyridoxine deficiency: Secondary | ICD-10-CM | POA: Diagnosis not present

## 2022-08-18 DIAGNOSIS — G63 Polyneuropathy in diseases classified elsewhere: Secondary | ICD-10-CM | POA: Diagnosis not present

## 2022-08-18 DIAGNOSIS — M47816 Spondylosis without myelopathy or radiculopathy, lumbar region: Secondary | ICD-10-CM | POA: Diagnosis not present

## 2022-08-18 DIAGNOSIS — E531 Pyridoxine deficiency: Secondary | ICD-10-CM | POA: Diagnosis not present

## 2022-08-18 DIAGNOSIS — G7281 Critical illness myopathy: Secondary | ICD-10-CM | POA: Diagnosis not present

## 2022-08-18 DIAGNOSIS — M48061 Spinal stenosis, lumbar region without neurogenic claudication: Secondary | ICD-10-CM | POA: Diagnosis not present

## 2022-08-18 DIAGNOSIS — G40909 Epilepsy, unspecified, not intractable, without status epilepticus: Secondary | ICD-10-CM | POA: Diagnosis not present

## 2022-08-20 DIAGNOSIS — M47816 Spondylosis without myelopathy or radiculopathy, lumbar region: Secondary | ICD-10-CM | POA: Diagnosis not present

## 2022-08-20 DIAGNOSIS — G7281 Critical illness myopathy: Secondary | ICD-10-CM | POA: Diagnosis not present

## 2022-08-20 DIAGNOSIS — G40909 Epilepsy, unspecified, not intractable, without status epilepticus: Secondary | ICD-10-CM | POA: Diagnosis not present

## 2022-08-20 DIAGNOSIS — G63 Polyneuropathy in diseases classified elsewhere: Secondary | ICD-10-CM | POA: Diagnosis not present

## 2022-08-20 DIAGNOSIS — M48061 Spinal stenosis, lumbar region without neurogenic claudication: Secondary | ICD-10-CM | POA: Diagnosis not present

## 2022-08-20 DIAGNOSIS — E531 Pyridoxine deficiency: Secondary | ICD-10-CM | POA: Diagnosis not present

## 2022-08-21 DIAGNOSIS — R296 Repeated falls: Secondary | ICD-10-CM | POA: Diagnosis not present

## 2022-08-21 DIAGNOSIS — G63 Polyneuropathy in diseases classified elsewhere: Secondary | ICD-10-CM | POA: Diagnosis not present

## 2022-08-21 DIAGNOSIS — R634 Abnormal weight loss: Secondary | ICD-10-CM | POA: Diagnosis not present

## 2022-08-21 DIAGNOSIS — Z6837 Body mass index (BMI) 37.0-37.9, adult: Secondary | ICD-10-CM | POA: Diagnosis not present

## 2022-08-21 DIAGNOSIS — G5601 Carpal tunnel syndrome, right upper limb: Secondary | ICD-10-CM | POA: Diagnosis not present

## 2022-08-21 DIAGNOSIS — Z853 Personal history of malignant neoplasm of breast: Secondary | ICD-10-CM | POA: Diagnosis not present

## 2022-08-21 DIAGNOSIS — E531 Pyridoxine deficiency: Secondary | ICD-10-CM | POA: Diagnosis not present

## 2022-08-21 DIAGNOSIS — Z993 Dependence on wheelchair: Secondary | ICD-10-CM | POA: Diagnosis not present

## 2022-08-21 DIAGNOSIS — M47816 Spondylosis without myelopathy or radiculopathy, lumbar region: Secondary | ICD-10-CM | POA: Diagnosis not present

## 2022-08-21 DIAGNOSIS — G40909 Epilepsy, unspecified, not intractable, without status epilepticus: Secondary | ICD-10-CM | POA: Diagnosis not present

## 2022-08-21 DIAGNOSIS — N189 Chronic kidney disease, unspecified: Secondary | ICD-10-CM | POA: Diagnosis not present

## 2022-08-21 DIAGNOSIS — M4803 Spinal stenosis, cervicothoracic region: Secondary | ICD-10-CM | POA: Diagnosis not present

## 2022-08-21 DIAGNOSIS — G7281 Critical illness myopathy: Secondary | ICD-10-CM | POA: Diagnosis not present

## 2022-08-21 DIAGNOSIS — Z9181 History of falling: Secondary | ICD-10-CM | POA: Diagnosis not present

## 2022-08-21 DIAGNOSIS — M8448XD Pathological fracture, other site, subsequent encounter for fracture with routine healing: Secondary | ICD-10-CM | POA: Diagnosis not present

## 2022-08-21 DIAGNOSIS — M48061 Spinal stenosis, lumbar region without neurogenic claudication: Secondary | ICD-10-CM | POA: Diagnosis not present

## 2022-08-22 DIAGNOSIS — G40909 Epilepsy, unspecified, not intractable, without status epilepticus: Secondary | ICD-10-CM | POA: Diagnosis not present

## 2022-08-22 DIAGNOSIS — M48061 Spinal stenosis, lumbar region without neurogenic claudication: Secondary | ICD-10-CM | POA: Diagnosis not present

## 2022-08-22 DIAGNOSIS — G63 Polyneuropathy in diseases classified elsewhere: Secondary | ICD-10-CM | POA: Diagnosis not present

## 2022-08-22 DIAGNOSIS — G7281 Critical illness myopathy: Secondary | ICD-10-CM | POA: Diagnosis not present

## 2022-08-22 DIAGNOSIS — M47816 Spondylosis without myelopathy or radiculopathy, lumbar region: Secondary | ICD-10-CM | POA: Diagnosis not present

## 2022-08-22 DIAGNOSIS — E531 Pyridoxine deficiency: Secondary | ICD-10-CM | POA: Diagnosis not present

## 2022-08-25 DIAGNOSIS — G40909 Epilepsy, unspecified, not intractable, without status epilepticus: Secondary | ICD-10-CM | POA: Diagnosis not present

## 2022-08-25 DIAGNOSIS — M47816 Spondylosis without myelopathy or radiculopathy, lumbar region: Secondary | ICD-10-CM | POA: Diagnosis not present

## 2022-08-25 DIAGNOSIS — E531 Pyridoxine deficiency: Secondary | ICD-10-CM | POA: Diagnosis not present

## 2022-08-25 DIAGNOSIS — M48061 Spinal stenosis, lumbar region without neurogenic claudication: Secondary | ICD-10-CM | POA: Diagnosis not present

## 2022-08-25 DIAGNOSIS — G7281 Critical illness myopathy: Secondary | ICD-10-CM | POA: Diagnosis not present

## 2022-08-25 DIAGNOSIS — G63 Polyneuropathy in diseases classified elsewhere: Secondary | ICD-10-CM | POA: Diagnosis not present

## 2022-08-29 DIAGNOSIS — G7281 Critical illness myopathy: Secondary | ICD-10-CM | POA: Diagnosis not present

## 2022-08-29 DIAGNOSIS — G63 Polyneuropathy in diseases classified elsewhere: Secondary | ICD-10-CM | POA: Diagnosis not present

## 2022-08-29 DIAGNOSIS — G40909 Epilepsy, unspecified, not intractable, without status epilepticus: Secondary | ICD-10-CM | POA: Diagnosis not present

## 2022-08-29 DIAGNOSIS — M48061 Spinal stenosis, lumbar region without neurogenic claudication: Secondary | ICD-10-CM | POA: Diagnosis not present

## 2022-08-29 DIAGNOSIS — M47816 Spondylosis without myelopathy or radiculopathy, lumbar region: Secondary | ICD-10-CM | POA: Diagnosis not present

## 2022-08-29 DIAGNOSIS — E531 Pyridoxine deficiency: Secondary | ICD-10-CM | POA: Diagnosis not present

## 2022-09-01 DIAGNOSIS — M47816 Spondylosis without myelopathy or radiculopathy, lumbar region: Secondary | ICD-10-CM | POA: Diagnosis not present

## 2022-09-01 DIAGNOSIS — G63 Polyneuropathy in diseases classified elsewhere: Secondary | ICD-10-CM | POA: Diagnosis not present

## 2022-09-01 DIAGNOSIS — G7281 Critical illness myopathy: Secondary | ICD-10-CM | POA: Diagnosis not present

## 2022-09-01 DIAGNOSIS — E531 Pyridoxine deficiency: Secondary | ICD-10-CM | POA: Diagnosis not present

## 2022-09-01 DIAGNOSIS — G40909 Epilepsy, unspecified, not intractable, without status epilepticus: Secondary | ICD-10-CM | POA: Diagnosis not present

## 2022-09-01 DIAGNOSIS — M48061 Spinal stenosis, lumbar region without neurogenic claudication: Secondary | ICD-10-CM | POA: Diagnosis not present

## 2022-09-03 DIAGNOSIS — G63 Polyneuropathy in diseases classified elsewhere: Secondary | ICD-10-CM | POA: Diagnosis not present

## 2022-09-03 DIAGNOSIS — G7281 Critical illness myopathy: Secondary | ICD-10-CM | POA: Diagnosis not present

## 2022-09-03 DIAGNOSIS — G40909 Epilepsy, unspecified, not intractable, without status epilepticus: Secondary | ICD-10-CM | POA: Diagnosis not present

## 2022-09-03 DIAGNOSIS — M47816 Spondylosis without myelopathy or radiculopathy, lumbar region: Secondary | ICD-10-CM | POA: Diagnosis not present

## 2022-09-03 DIAGNOSIS — E531 Pyridoxine deficiency: Secondary | ICD-10-CM | POA: Diagnosis not present

## 2022-09-03 DIAGNOSIS — M48061 Spinal stenosis, lumbar region without neurogenic claudication: Secondary | ICD-10-CM | POA: Diagnosis not present

## 2022-09-05 DIAGNOSIS — M48061 Spinal stenosis, lumbar region without neurogenic claudication: Secondary | ICD-10-CM | POA: Diagnosis not present

## 2022-09-05 DIAGNOSIS — E531 Pyridoxine deficiency: Secondary | ICD-10-CM | POA: Diagnosis not present

## 2022-09-05 DIAGNOSIS — G7281 Critical illness myopathy: Secondary | ICD-10-CM | POA: Diagnosis not present

## 2022-09-05 DIAGNOSIS — G63 Polyneuropathy in diseases classified elsewhere: Secondary | ICD-10-CM | POA: Diagnosis not present

## 2022-09-05 DIAGNOSIS — G40909 Epilepsy, unspecified, not intractable, without status epilepticus: Secondary | ICD-10-CM | POA: Diagnosis not present

## 2022-09-05 DIAGNOSIS — M47816 Spondylosis without myelopathy or radiculopathy, lumbar region: Secondary | ICD-10-CM | POA: Diagnosis not present

## 2022-09-08 DIAGNOSIS — G7281 Critical illness myopathy: Secondary | ICD-10-CM | POA: Diagnosis not present

## 2022-09-08 DIAGNOSIS — M47816 Spondylosis without myelopathy or radiculopathy, lumbar region: Secondary | ICD-10-CM | POA: Diagnosis not present

## 2022-09-08 DIAGNOSIS — G63 Polyneuropathy in diseases classified elsewhere: Secondary | ICD-10-CM | POA: Diagnosis not present

## 2022-09-08 DIAGNOSIS — G40909 Epilepsy, unspecified, not intractable, without status epilepticus: Secondary | ICD-10-CM | POA: Diagnosis not present

## 2022-09-08 DIAGNOSIS — M48061 Spinal stenosis, lumbar region without neurogenic claudication: Secondary | ICD-10-CM | POA: Diagnosis not present

## 2022-09-08 DIAGNOSIS — E531 Pyridoxine deficiency: Secondary | ICD-10-CM | POA: Diagnosis not present

## 2022-09-11 DIAGNOSIS — G40909 Epilepsy, unspecified, not intractable, without status epilepticus: Secondary | ICD-10-CM | POA: Diagnosis not present

## 2022-09-11 DIAGNOSIS — M47816 Spondylosis without myelopathy or radiculopathy, lumbar region: Secondary | ICD-10-CM | POA: Diagnosis not present

## 2022-09-11 DIAGNOSIS — G7281 Critical illness myopathy: Secondary | ICD-10-CM | POA: Diagnosis not present

## 2022-09-11 DIAGNOSIS — G63 Polyneuropathy in diseases classified elsewhere: Secondary | ICD-10-CM | POA: Diagnosis not present

## 2022-09-11 DIAGNOSIS — M48061 Spinal stenosis, lumbar region without neurogenic claudication: Secondary | ICD-10-CM | POA: Diagnosis not present

## 2022-09-11 DIAGNOSIS — E531 Pyridoxine deficiency: Secondary | ICD-10-CM | POA: Diagnosis not present

## 2022-09-15 DIAGNOSIS — E531 Pyridoxine deficiency: Secondary | ICD-10-CM | POA: Diagnosis not present

## 2022-09-15 DIAGNOSIS — M48061 Spinal stenosis, lumbar region without neurogenic claudication: Secondary | ICD-10-CM | POA: Diagnosis not present

## 2022-09-15 DIAGNOSIS — M47816 Spondylosis without myelopathy or radiculopathy, lumbar region: Secondary | ICD-10-CM | POA: Diagnosis not present

## 2022-09-15 DIAGNOSIS — G7281 Critical illness myopathy: Secondary | ICD-10-CM | POA: Diagnosis not present

## 2022-09-15 DIAGNOSIS — G40909 Epilepsy, unspecified, not intractable, without status epilepticus: Secondary | ICD-10-CM | POA: Diagnosis not present

## 2022-09-15 DIAGNOSIS — G63 Polyneuropathy in diseases classified elsewhere: Secondary | ICD-10-CM | POA: Diagnosis not present

## 2022-09-16 DIAGNOSIS — G7281 Critical illness myopathy: Secondary | ICD-10-CM | POA: Diagnosis not present

## 2022-09-16 DIAGNOSIS — M47816 Spondylosis without myelopathy or radiculopathy, lumbar region: Secondary | ICD-10-CM | POA: Diagnosis not present

## 2022-09-16 DIAGNOSIS — G40909 Epilepsy, unspecified, not intractable, without status epilepticus: Secondary | ICD-10-CM | POA: Diagnosis not present

## 2022-09-16 DIAGNOSIS — E531 Pyridoxine deficiency: Secondary | ICD-10-CM | POA: Diagnosis not present

## 2022-09-16 DIAGNOSIS — M48061 Spinal stenosis, lumbar region without neurogenic claudication: Secondary | ICD-10-CM | POA: Diagnosis not present

## 2022-09-16 DIAGNOSIS — G63 Polyneuropathy in diseases classified elsewhere: Secondary | ICD-10-CM | POA: Diagnosis not present

## 2022-09-17 DIAGNOSIS — G7281 Critical illness myopathy: Secondary | ICD-10-CM | POA: Diagnosis not present

## 2022-09-17 DIAGNOSIS — E531 Pyridoxine deficiency: Secondary | ICD-10-CM | POA: Diagnosis not present

## 2022-09-17 DIAGNOSIS — M47816 Spondylosis without myelopathy or radiculopathy, lumbar region: Secondary | ICD-10-CM | POA: Diagnosis not present

## 2022-09-17 DIAGNOSIS — G63 Polyneuropathy in diseases classified elsewhere: Secondary | ICD-10-CM | POA: Diagnosis not present

## 2022-09-17 DIAGNOSIS — M48061 Spinal stenosis, lumbar region without neurogenic claudication: Secondary | ICD-10-CM | POA: Diagnosis not present

## 2022-09-17 DIAGNOSIS — G40909 Epilepsy, unspecified, not intractable, without status epilepticus: Secondary | ICD-10-CM | POA: Diagnosis not present

## 2022-09-20 DIAGNOSIS — G63 Polyneuropathy in diseases classified elsewhere: Secondary | ICD-10-CM | POA: Diagnosis not present

## 2022-09-20 DIAGNOSIS — G5601 Carpal tunnel syndrome, right upper limb: Secondary | ICD-10-CM | POA: Diagnosis not present

## 2022-09-20 DIAGNOSIS — G40909 Epilepsy, unspecified, not intractable, without status epilepticus: Secondary | ICD-10-CM | POA: Diagnosis not present

## 2022-09-20 DIAGNOSIS — Z9181 History of falling: Secondary | ICD-10-CM | POA: Diagnosis not present

## 2022-09-20 DIAGNOSIS — G7281 Critical illness myopathy: Secondary | ICD-10-CM | POA: Diagnosis not present

## 2022-09-20 DIAGNOSIS — Z853 Personal history of malignant neoplasm of breast: Secondary | ICD-10-CM | POA: Diagnosis not present

## 2022-09-20 DIAGNOSIS — Z6837 Body mass index (BMI) 37.0-37.9, adult: Secondary | ICD-10-CM | POA: Diagnosis not present

## 2022-09-20 DIAGNOSIS — Z993 Dependence on wheelchair: Secondary | ICD-10-CM | POA: Diagnosis not present

## 2022-09-20 DIAGNOSIS — M47816 Spondylosis without myelopathy or radiculopathy, lumbar region: Secondary | ICD-10-CM | POA: Diagnosis not present

## 2022-09-20 DIAGNOSIS — M48061 Spinal stenosis, lumbar region without neurogenic claudication: Secondary | ICD-10-CM | POA: Diagnosis not present

## 2022-09-20 DIAGNOSIS — R634 Abnormal weight loss: Secondary | ICD-10-CM | POA: Diagnosis not present

## 2022-09-20 DIAGNOSIS — R296 Repeated falls: Secondary | ICD-10-CM | POA: Diagnosis not present

## 2022-09-20 DIAGNOSIS — M8448XD Pathological fracture, other site, subsequent encounter for fracture with routine healing: Secondary | ICD-10-CM | POA: Diagnosis not present

## 2022-09-20 DIAGNOSIS — E531 Pyridoxine deficiency: Secondary | ICD-10-CM | POA: Diagnosis not present

## 2022-09-20 DIAGNOSIS — N189 Chronic kidney disease, unspecified: Secondary | ICD-10-CM | POA: Diagnosis not present

## 2022-09-20 DIAGNOSIS — M4803 Spinal stenosis, cervicothoracic region: Secondary | ICD-10-CM | POA: Diagnosis not present

## 2022-09-23 DIAGNOSIS — M48061 Spinal stenosis, lumbar region without neurogenic claudication: Secondary | ICD-10-CM | POA: Diagnosis not present

## 2022-09-23 DIAGNOSIS — E531 Pyridoxine deficiency: Secondary | ICD-10-CM | POA: Diagnosis not present

## 2022-09-23 DIAGNOSIS — G40909 Epilepsy, unspecified, not intractable, without status epilepticus: Secondary | ICD-10-CM | POA: Diagnosis not present

## 2022-09-23 DIAGNOSIS — G7281 Critical illness myopathy: Secondary | ICD-10-CM | POA: Diagnosis not present

## 2022-09-23 DIAGNOSIS — G63 Polyneuropathy in diseases classified elsewhere: Secondary | ICD-10-CM | POA: Diagnosis not present

## 2022-09-23 DIAGNOSIS — M47816 Spondylosis without myelopathy or radiculopathy, lumbar region: Secondary | ICD-10-CM | POA: Diagnosis not present

## 2022-09-26 DIAGNOSIS — M48061 Spinal stenosis, lumbar region without neurogenic claudication: Secondary | ICD-10-CM | POA: Diagnosis not present

## 2022-09-26 DIAGNOSIS — G7281 Critical illness myopathy: Secondary | ICD-10-CM | POA: Diagnosis not present

## 2022-09-26 DIAGNOSIS — G63 Polyneuropathy in diseases classified elsewhere: Secondary | ICD-10-CM | POA: Diagnosis not present

## 2022-09-26 DIAGNOSIS — G40909 Epilepsy, unspecified, not intractable, without status epilepticus: Secondary | ICD-10-CM | POA: Diagnosis not present

## 2022-09-26 DIAGNOSIS — E531 Pyridoxine deficiency: Secondary | ICD-10-CM | POA: Diagnosis not present

## 2022-09-26 DIAGNOSIS — M47816 Spondylosis without myelopathy or radiculopathy, lumbar region: Secondary | ICD-10-CM | POA: Diagnosis not present

## 2022-09-27 DIAGNOSIS — M47816 Spondylosis without myelopathy or radiculopathy, lumbar region: Secondary | ICD-10-CM | POA: Diagnosis not present

## 2022-09-27 DIAGNOSIS — G63 Polyneuropathy in diseases classified elsewhere: Secondary | ICD-10-CM | POA: Diagnosis not present

## 2022-09-27 DIAGNOSIS — M48061 Spinal stenosis, lumbar region without neurogenic claudication: Secondary | ICD-10-CM | POA: Diagnosis not present

## 2022-09-27 DIAGNOSIS — E531 Pyridoxine deficiency: Secondary | ICD-10-CM | POA: Diagnosis not present

## 2022-09-27 DIAGNOSIS — G7281 Critical illness myopathy: Secondary | ICD-10-CM | POA: Diagnosis not present

## 2022-09-27 DIAGNOSIS — G40909 Epilepsy, unspecified, not intractable, without status epilepticus: Secondary | ICD-10-CM | POA: Diagnosis not present

## 2022-09-29 DIAGNOSIS — M48061 Spinal stenosis, lumbar region without neurogenic claudication: Secondary | ICD-10-CM | POA: Diagnosis not present

## 2022-09-29 DIAGNOSIS — E531 Pyridoxine deficiency: Secondary | ICD-10-CM | POA: Diagnosis not present

## 2022-09-29 DIAGNOSIS — M47816 Spondylosis without myelopathy or radiculopathy, lumbar region: Secondary | ICD-10-CM | POA: Diagnosis not present

## 2022-09-29 DIAGNOSIS — G40909 Epilepsy, unspecified, not intractable, without status epilepticus: Secondary | ICD-10-CM | POA: Diagnosis not present

## 2022-09-29 DIAGNOSIS — G7281 Critical illness myopathy: Secondary | ICD-10-CM | POA: Diagnosis not present

## 2022-09-29 DIAGNOSIS — G63 Polyneuropathy in diseases classified elsewhere: Secondary | ICD-10-CM | POA: Diagnosis not present

## 2022-09-30 DIAGNOSIS — G7281 Critical illness myopathy: Secondary | ICD-10-CM | POA: Diagnosis not present

## 2022-09-30 DIAGNOSIS — G63 Polyneuropathy in diseases classified elsewhere: Secondary | ICD-10-CM | POA: Diagnosis not present

## 2022-09-30 DIAGNOSIS — E531 Pyridoxine deficiency: Secondary | ICD-10-CM | POA: Diagnosis not present

## 2022-10-02 DIAGNOSIS — G63 Polyneuropathy in diseases classified elsewhere: Secondary | ICD-10-CM | POA: Diagnosis not present

## 2022-10-02 DIAGNOSIS — M47816 Spondylosis without myelopathy or radiculopathy, lumbar region: Secondary | ICD-10-CM | POA: Diagnosis not present

## 2022-10-02 DIAGNOSIS — G7281 Critical illness myopathy: Secondary | ICD-10-CM | POA: Diagnosis not present

## 2022-10-02 DIAGNOSIS — G40909 Epilepsy, unspecified, not intractable, without status epilepticus: Secondary | ICD-10-CM | POA: Diagnosis not present

## 2022-10-02 DIAGNOSIS — M48061 Spinal stenosis, lumbar region without neurogenic claudication: Secondary | ICD-10-CM | POA: Diagnosis not present

## 2022-10-02 DIAGNOSIS — E531 Pyridoxine deficiency: Secondary | ICD-10-CM | POA: Diagnosis not present

## 2022-10-07 DIAGNOSIS — M47816 Spondylosis without myelopathy or radiculopathy, lumbar region: Secondary | ICD-10-CM | POA: Diagnosis not present

## 2022-10-07 DIAGNOSIS — M48061 Spinal stenosis, lumbar region without neurogenic claudication: Secondary | ICD-10-CM | POA: Diagnosis not present

## 2022-10-07 DIAGNOSIS — E531 Pyridoxine deficiency: Secondary | ICD-10-CM | POA: Diagnosis not present

## 2022-10-07 DIAGNOSIS — G40909 Epilepsy, unspecified, not intractable, without status epilepticus: Secondary | ICD-10-CM | POA: Diagnosis not present

## 2022-10-07 DIAGNOSIS — G7281 Critical illness myopathy: Secondary | ICD-10-CM | POA: Diagnosis not present

## 2022-10-07 DIAGNOSIS — G63 Polyneuropathy in diseases classified elsewhere: Secondary | ICD-10-CM | POA: Diagnosis not present

## 2022-10-09 DIAGNOSIS — E531 Pyridoxine deficiency: Secondary | ICD-10-CM | POA: Diagnosis not present

## 2022-10-09 DIAGNOSIS — G7281 Critical illness myopathy: Secondary | ICD-10-CM | POA: Diagnosis not present

## 2022-10-09 DIAGNOSIS — M48061 Spinal stenosis, lumbar region without neurogenic claudication: Secondary | ICD-10-CM | POA: Diagnosis not present

## 2022-10-09 DIAGNOSIS — M47816 Spondylosis without myelopathy or radiculopathy, lumbar region: Secondary | ICD-10-CM | POA: Diagnosis not present

## 2022-10-09 DIAGNOSIS — G40909 Epilepsy, unspecified, not intractable, without status epilepticus: Secondary | ICD-10-CM | POA: Diagnosis not present

## 2022-10-09 DIAGNOSIS — G63 Polyneuropathy in diseases classified elsewhere: Secondary | ICD-10-CM | POA: Diagnosis not present

## 2022-10-14 ENCOUNTER — Ambulatory Visit: Payer: Medicare Other | Admitting: Oncology

## 2022-10-14 DIAGNOSIS — G63 Polyneuropathy in diseases classified elsewhere: Secondary | ICD-10-CM | POA: Diagnosis not present

## 2022-10-14 DIAGNOSIS — G40909 Epilepsy, unspecified, not intractable, without status epilepticus: Secondary | ICD-10-CM | POA: Diagnosis not present

## 2022-10-14 DIAGNOSIS — G7281 Critical illness myopathy: Secondary | ICD-10-CM | POA: Diagnosis not present

## 2022-10-14 DIAGNOSIS — M47816 Spondylosis without myelopathy or radiculopathy, lumbar region: Secondary | ICD-10-CM | POA: Diagnosis not present

## 2022-10-14 DIAGNOSIS — E531 Pyridoxine deficiency: Secondary | ICD-10-CM | POA: Diagnosis not present

## 2022-10-14 DIAGNOSIS — M48061 Spinal stenosis, lumbar region without neurogenic claudication: Secondary | ICD-10-CM | POA: Diagnosis not present

## 2022-10-15 DIAGNOSIS — E531 Pyridoxine deficiency: Secondary | ICD-10-CM | POA: Diagnosis not present

## 2022-10-15 DIAGNOSIS — M47816 Spondylosis without myelopathy or radiculopathy, lumbar region: Secondary | ICD-10-CM | POA: Diagnosis not present

## 2022-10-15 DIAGNOSIS — G40909 Epilepsy, unspecified, not intractable, without status epilepticus: Secondary | ICD-10-CM | POA: Diagnosis not present

## 2022-10-15 DIAGNOSIS — M48061 Spinal stenosis, lumbar region without neurogenic claudication: Secondary | ICD-10-CM | POA: Diagnosis not present

## 2022-10-15 DIAGNOSIS — G7281 Critical illness myopathy: Secondary | ICD-10-CM | POA: Diagnosis not present

## 2022-10-15 DIAGNOSIS — G63 Polyneuropathy in diseases classified elsewhere: Secondary | ICD-10-CM | POA: Diagnosis not present

## 2022-10-16 DIAGNOSIS — G40909 Epilepsy, unspecified, not intractable, without status epilepticus: Secondary | ICD-10-CM | POA: Diagnosis not present

## 2022-10-16 DIAGNOSIS — G7281 Critical illness myopathy: Secondary | ICD-10-CM | POA: Diagnosis not present

## 2022-10-16 DIAGNOSIS — E531 Pyridoxine deficiency: Secondary | ICD-10-CM | POA: Diagnosis not present

## 2022-10-16 DIAGNOSIS — G63 Polyneuropathy in diseases classified elsewhere: Secondary | ICD-10-CM | POA: Diagnosis not present

## 2022-10-16 DIAGNOSIS — M48061 Spinal stenosis, lumbar region without neurogenic claudication: Secondary | ICD-10-CM | POA: Diagnosis not present

## 2022-10-16 DIAGNOSIS — M47816 Spondylosis without myelopathy or radiculopathy, lumbar region: Secondary | ICD-10-CM | POA: Diagnosis not present

## 2022-10-16 NOTE — Progress Notes (Signed)
[facility]  [address] [place],  [place]  [address] [phone]  Clinic Day:  10/17/2022  Referring physician: Marylen Ponto, MD   HISTORY OF PRESENT ILLNESS:  The patient is a 72 y.o. 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 The patient also underwent adjuvant breast radiation to prevent local disease recurrence.  She also took anastrozole for 5 years for her adjuvant endocrine management.  She comes in today for routine follow-up.  Since her last visit, the patient has been doing okay.  As it pertains to her breast cancer, she continues to deny having any breast changes which concern her for disease recurrence.  Of note,due to being in a wheelchair and having limited mobility, she has not had a mammogram since July 2020.  PHYSICAL EXAM:  Blood pressure 132/76, pulse 88, temperature 98.8 F (37.1 C), resp. rate 14, SpO2 92 %. Wt Readings from Last 3 Encounters:  10/12/20 140 lb (63.5 kg)  06/06/19 236 lb (107 kg)   There is no height or weight on file to calculate BMI. Performance status (ECOG): 3 Physical Exam Constitutional:      Appearance: She is ill-appearing.     Comments: Chronically ill-appearing woman in a wheelchair.  HENT:     Mouth/Throat:     Pharynx: Oropharynx is clear. No oropharyngeal exudate.  Cardiovascular:     Rate and Rhythm: Normal rate and regular rhythm.     Heart sounds: No murmur heard.    No friction rub. No gallop.  Pulmonary:     Breath sounds: Normal breath sounds.  Chest:  Breasts:    Right: Normal. No swelling, bleeding, inverted nipple, mass, nipple discharge or skin change.     Left: Normal. No swelling, bleeding, inverted nipple, mass, nipple discharge or skin change.  Abdominal:     General: Bowel sounds are normal. There is no distension.     Palpations: Abdomen is soft. There is no mass.     Tenderness: There is no abdominal  tenderness.  Musculoskeletal:        General: No tenderness.     Cervical back: Normal range of motion and neck supple.     Right lower leg: No edema.     Left lower leg: No edema.  Lymphadenopathy:     Cervical: No cervical adenopathy.     Right cervical: No superficial, deep or posterior cervical adenopathy.    Left cervical: No superficial, deep or posterior cervical adenopathy.     Upper Body:     Right upper body: No supraclavicular or axillary adenopathy.     Left upper body: No supraclavicular or axillary adenopathy.     Lower Body: No right inguinal adenopathy. No left inguinal adenopathy.  Skin:    Coloration: Skin is not jaundiced.     Findings: No lesion or rash.  Neurological:     General: No focal deficit present.     Mental Status: She is alert and oriented to person, place, and time. Mental status is at baseline.  Psychiatric:        Mood and Affect: Mood normal.        Behavior: Behavior normal.        Thought Content: Thought content normal.        Judgment: Judgment normal.   ASSESSMENT & PLAN:  Assessment/Plan:  A 72 y.o. female with stage IA (T1a N0 M0) hormone positive breast  cancer, who is over 7-1/2 years out from her lumpectomy.  Based upon her clinical breast exam today, the patient remains disease free.  Our office will try to get her set up for mammogram in a few weeks; a request was made to ensure there are enough people available to move her from her wheelchair to the table for her mammogram.  Otherwise, as she is doing well from a breast cancer perspective, I will see her back in 1 year for repeat clinical assessment. The patient understands all the plans discussed today and is in agreement with them.    Leeba Barbe Kirby Funk, MD

## 2022-10-17 ENCOUNTER — Inpatient Hospital Stay: Payer: Medicare Other | Attending: Oncology | Admitting: Oncology

## 2022-10-17 ENCOUNTER — Other Ambulatory Visit: Payer: Self-pay | Admitting: Oncology

## 2022-10-17 VITALS — BP 132/76 | HR 88 | Temp 98.8°F | Resp 14

## 2022-10-17 DIAGNOSIS — Z170 Estrogen receptor positive status [ER+]: Secondary | ICD-10-CM

## 2022-10-17 DIAGNOSIS — Z1231 Encounter for screening mammogram for malignant neoplasm of breast: Secondary | ICD-10-CM

## 2022-10-17 DIAGNOSIS — C50111 Malignant neoplasm of central portion of right female breast: Secondary | ICD-10-CM | POA: Diagnosis not present

## 2022-10-20 DIAGNOSIS — R634 Abnormal weight loss: Secondary | ICD-10-CM | POA: Diagnosis not present

## 2022-10-20 DIAGNOSIS — Z853 Personal history of malignant neoplasm of breast: Secondary | ICD-10-CM | POA: Diagnosis not present

## 2022-10-20 DIAGNOSIS — G63 Polyneuropathy in diseases classified elsewhere: Secondary | ICD-10-CM | POA: Diagnosis not present

## 2022-10-20 DIAGNOSIS — E531 Pyridoxine deficiency: Secondary | ICD-10-CM | POA: Diagnosis not present

## 2022-10-20 DIAGNOSIS — M4803 Spinal stenosis, cervicothoracic region: Secondary | ICD-10-CM | POA: Diagnosis not present

## 2022-10-20 DIAGNOSIS — R296 Repeated falls: Secondary | ICD-10-CM | POA: Diagnosis not present

## 2022-10-20 DIAGNOSIS — M48061 Spinal stenosis, lumbar region without neurogenic claudication: Secondary | ICD-10-CM | POA: Diagnosis not present

## 2022-10-20 DIAGNOSIS — Z9181 History of falling: Secondary | ICD-10-CM | POA: Diagnosis not present

## 2022-10-20 DIAGNOSIS — M8448XD Pathological fracture, other site, subsequent encounter for fracture with routine healing: Secondary | ICD-10-CM | POA: Diagnosis not present

## 2022-10-20 DIAGNOSIS — N189 Chronic kidney disease, unspecified: Secondary | ICD-10-CM | POA: Diagnosis not present

## 2022-10-20 DIAGNOSIS — Z6837 Body mass index (BMI) 37.0-37.9, adult: Secondary | ICD-10-CM | POA: Diagnosis not present

## 2022-10-20 DIAGNOSIS — M47816 Spondylosis without myelopathy or radiculopathy, lumbar region: Secondary | ICD-10-CM | POA: Diagnosis not present

## 2022-10-20 DIAGNOSIS — Z993 Dependence on wheelchair: Secondary | ICD-10-CM | POA: Diagnosis not present

## 2022-10-20 DIAGNOSIS — G5601 Carpal tunnel syndrome, right upper limb: Secondary | ICD-10-CM | POA: Diagnosis not present

## 2022-10-20 DIAGNOSIS — G7281 Critical illness myopathy: Secondary | ICD-10-CM | POA: Diagnosis not present

## 2022-10-20 DIAGNOSIS — G40909 Epilepsy, unspecified, not intractable, without status epilepticus: Secondary | ICD-10-CM | POA: Diagnosis not present

## 2022-10-28 DIAGNOSIS — G40909 Epilepsy, unspecified, not intractable, without status epilepticus: Secondary | ICD-10-CM | POA: Diagnosis not present

## 2022-10-28 DIAGNOSIS — M48061 Spinal stenosis, lumbar region without neurogenic claudication: Secondary | ICD-10-CM | POA: Diagnosis not present

## 2022-10-28 DIAGNOSIS — G63 Polyneuropathy in diseases classified elsewhere: Secondary | ICD-10-CM | POA: Diagnosis not present

## 2022-10-28 DIAGNOSIS — E531 Pyridoxine deficiency: Secondary | ICD-10-CM | POA: Diagnosis not present

## 2022-10-28 DIAGNOSIS — G7281 Critical illness myopathy: Secondary | ICD-10-CM | POA: Diagnosis not present

## 2022-10-28 DIAGNOSIS — M47816 Spondylosis without myelopathy or radiculopathy, lumbar region: Secondary | ICD-10-CM | POA: Diagnosis not present

## 2022-11-05 DIAGNOSIS — G40909 Epilepsy, unspecified, not intractable, without status epilepticus: Secondary | ICD-10-CM | POA: Diagnosis not present

## 2022-11-05 DIAGNOSIS — G7281 Critical illness myopathy: Secondary | ICD-10-CM | POA: Diagnosis not present

## 2022-11-05 DIAGNOSIS — G63 Polyneuropathy in diseases classified elsewhere: Secondary | ICD-10-CM | POA: Diagnosis not present

## 2022-11-05 DIAGNOSIS — M48061 Spinal stenosis, lumbar region without neurogenic claudication: Secondary | ICD-10-CM | POA: Diagnosis not present

## 2022-11-05 DIAGNOSIS — M47816 Spondylosis without myelopathy or radiculopathy, lumbar region: Secondary | ICD-10-CM | POA: Diagnosis not present

## 2022-11-05 DIAGNOSIS — E531 Pyridoxine deficiency: Secondary | ICD-10-CM | POA: Diagnosis not present

## 2022-11-15 DIAGNOSIS — M48061 Spinal stenosis, lumbar region without neurogenic claudication: Secondary | ICD-10-CM | POA: Diagnosis not present

## 2022-11-15 DIAGNOSIS — G7281 Critical illness myopathy: Secondary | ICD-10-CM | POA: Diagnosis not present

## 2022-11-15 DIAGNOSIS — G63 Polyneuropathy in diseases classified elsewhere: Secondary | ICD-10-CM | POA: Diagnosis not present

## 2022-11-15 DIAGNOSIS — E531 Pyridoxine deficiency: Secondary | ICD-10-CM | POA: Diagnosis not present

## 2022-11-15 DIAGNOSIS — M47816 Spondylosis without myelopathy or radiculopathy, lumbar region: Secondary | ICD-10-CM | POA: Diagnosis not present

## 2022-11-15 DIAGNOSIS — G40909 Epilepsy, unspecified, not intractable, without status epilepticus: Secondary | ICD-10-CM | POA: Diagnosis not present

## 2022-11-19 DIAGNOSIS — M48061 Spinal stenosis, lumbar region without neurogenic claudication: Secondary | ICD-10-CM | POA: Diagnosis not present

## 2022-11-19 DIAGNOSIS — Z9181 History of falling: Secondary | ICD-10-CM | POA: Diagnosis not present

## 2022-11-19 DIAGNOSIS — Z7401 Bed confinement status: Secondary | ICD-10-CM | POA: Diagnosis not present

## 2022-11-19 DIAGNOSIS — R634 Abnormal weight loss: Secondary | ICD-10-CM | POA: Diagnosis not present

## 2022-11-19 DIAGNOSIS — M8448XD Pathological fracture, other site, subsequent encounter for fracture with routine healing: Secondary | ICD-10-CM | POA: Diagnosis not present

## 2022-11-19 DIAGNOSIS — M47816 Spondylosis without myelopathy or radiculopathy, lumbar region: Secondary | ICD-10-CM | POA: Diagnosis not present

## 2022-11-19 DIAGNOSIS — Z6837 Body mass index (BMI) 37.0-37.9, adult: Secondary | ICD-10-CM | POA: Diagnosis not present

## 2022-11-19 DIAGNOSIS — G7281 Critical illness myopathy: Secondary | ICD-10-CM | POA: Diagnosis not present

## 2022-11-19 DIAGNOSIS — E531 Pyridoxine deficiency: Secondary | ICD-10-CM | POA: Diagnosis not present

## 2022-11-19 DIAGNOSIS — M4803 Spinal stenosis, cervicothoracic region: Secondary | ICD-10-CM | POA: Diagnosis not present

## 2022-11-19 DIAGNOSIS — Z853 Personal history of malignant neoplasm of breast: Secondary | ICD-10-CM | POA: Diagnosis not present

## 2022-11-19 DIAGNOSIS — G5601 Carpal tunnel syndrome, right upper limb: Secondary | ICD-10-CM | POA: Diagnosis not present

## 2022-11-19 DIAGNOSIS — G63 Polyneuropathy in diseases classified elsewhere: Secondary | ICD-10-CM | POA: Diagnosis not present

## 2022-11-19 DIAGNOSIS — G40909 Epilepsy, unspecified, not intractable, without status epilepticus: Secondary | ICD-10-CM | POA: Diagnosis not present

## 2022-11-19 DIAGNOSIS — N189 Chronic kidney disease, unspecified: Secondary | ICD-10-CM | POA: Diagnosis not present

## 2022-11-22 DIAGNOSIS — E531 Pyridoxine deficiency: Secondary | ICD-10-CM | POA: Diagnosis not present

## 2022-11-22 DIAGNOSIS — G7281 Critical illness myopathy: Secondary | ICD-10-CM | POA: Diagnosis not present

## 2022-11-22 DIAGNOSIS — M47816 Spondylosis without myelopathy or radiculopathy, lumbar region: Secondary | ICD-10-CM | POA: Diagnosis not present

## 2022-11-22 DIAGNOSIS — G63 Polyneuropathy in diseases classified elsewhere: Secondary | ICD-10-CM | POA: Diagnosis not present

## 2022-11-22 DIAGNOSIS — G40909 Epilepsy, unspecified, not intractable, without status epilepticus: Secondary | ICD-10-CM | POA: Diagnosis not present

## 2022-11-22 DIAGNOSIS — M48061 Spinal stenosis, lumbar region without neurogenic claudication: Secondary | ICD-10-CM | POA: Diagnosis not present

## 2022-11-24 DIAGNOSIS — G7281 Critical illness myopathy: Secondary | ICD-10-CM | POA: Diagnosis not present

## 2022-11-24 DIAGNOSIS — E531 Pyridoxine deficiency: Secondary | ICD-10-CM | POA: Diagnosis not present

## 2022-11-24 DIAGNOSIS — M47816 Spondylosis without myelopathy or radiculopathy, lumbar region: Secondary | ICD-10-CM | POA: Diagnosis not present

## 2022-11-24 DIAGNOSIS — M48061 Spinal stenosis, lumbar region without neurogenic claudication: Secondary | ICD-10-CM | POA: Diagnosis not present

## 2022-11-24 DIAGNOSIS — G40909 Epilepsy, unspecified, not intractable, without status epilepticus: Secondary | ICD-10-CM | POA: Diagnosis not present

## 2022-11-24 DIAGNOSIS — G63 Polyneuropathy in diseases classified elsewhere: Secondary | ICD-10-CM | POA: Diagnosis not present

## 2022-11-26 DIAGNOSIS — E531 Pyridoxine deficiency: Secondary | ICD-10-CM | POA: Diagnosis not present

## 2022-11-26 DIAGNOSIS — M47816 Spondylosis without myelopathy or radiculopathy, lumbar region: Secondary | ICD-10-CM | POA: Diagnosis not present

## 2022-11-26 DIAGNOSIS — M48061 Spinal stenosis, lumbar region without neurogenic claudication: Secondary | ICD-10-CM | POA: Diagnosis not present

## 2022-11-26 DIAGNOSIS — G40909 Epilepsy, unspecified, not intractable, without status epilepticus: Secondary | ICD-10-CM | POA: Diagnosis not present

## 2022-11-26 DIAGNOSIS — G63 Polyneuropathy in diseases classified elsewhere: Secondary | ICD-10-CM | POA: Diagnosis not present

## 2022-11-26 DIAGNOSIS — G7281 Critical illness myopathy: Secondary | ICD-10-CM | POA: Diagnosis not present

## 2022-11-28 DIAGNOSIS — G40909 Epilepsy, unspecified, not intractable, without status epilepticus: Secondary | ICD-10-CM | POA: Diagnosis not present

## 2022-11-28 DIAGNOSIS — G63 Polyneuropathy in diseases classified elsewhere: Secondary | ICD-10-CM | POA: Diagnosis not present

## 2022-11-28 DIAGNOSIS — E531 Pyridoxine deficiency: Secondary | ICD-10-CM | POA: Diagnosis not present

## 2022-12-01 DIAGNOSIS — M47816 Spondylosis without myelopathy or radiculopathy, lumbar region: Secondary | ICD-10-CM | POA: Diagnosis not present

## 2022-12-01 DIAGNOSIS — M48061 Spinal stenosis, lumbar region without neurogenic claudication: Secondary | ICD-10-CM | POA: Diagnosis not present

## 2022-12-01 DIAGNOSIS — G40909 Epilepsy, unspecified, not intractable, without status epilepticus: Secondary | ICD-10-CM | POA: Diagnosis not present

## 2022-12-01 DIAGNOSIS — G63 Polyneuropathy in diseases classified elsewhere: Secondary | ICD-10-CM | POA: Diagnosis not present

## 2022-12-01 DIAGNOSIS — G7281 Critical illness myopathy: Secondary | ICD-10-CM | POA: Diagnosis not present

## 2022-12-01 DIAGNOSIS — E531 Pyridoxine deficiency: Secondary | ICD-10-CM | POA: Diagnosis not present

## 2022-12-03 DIAGNOSIS — G40909 Epilepsy, unspecified, not intractable, without status epilepticus: Secondary | ICD-10-CM | POA: Diagnosis not present

## 2022-12-03 DIAGNOSIS — G7281 Critical illness myopathy: Secondary | ICD-10-CM | POA: Diagnosis not present

## 2022-12-03 DIAGNOSIS — E531 Pyridoxine deficiency: Secondary | ICD-10-CM | POA: Diagnosis not present

## 2022-12-03 DIAGNOSIS — M47816 Spondylosis without myelopathy or radiculopathy, lumbar region: Secondary | ICD-10-CM | POA: Diagnosis not present

## 2022-12-03 DIAGNOSIS — M48061 Spinal stenosis, lumbar region without neurogenic claudication: Secondary | ICD-10-CM | POA: Diagnosis not present

## 2022-12-03 DIAGNOSIS — G63 Polyneuropathy in diseases classified elsewhere: Secondary | ICD-10-CM | POA: Diagnosis not present

## 2022-12-08 DIAGNOSIS — G63 Polyneuropathy in diseases classified elsewhere: Secondary | ICD-10-CM | POA: Diagnosis not present

## 2022-12-08 DIAGNOSIS — G7281 Critical illness myopathy: Secondary | ICD-10-CM | POA: Diagnosis not present

## 2022-12-08 DIAGNOSIS — E531 Pyridoxine deficiency: Secondary | ICD-10-CM | POA: Diagnosis not present

## 2022-12-08 DIAGNOSIS — G40909 Epilepsy, unspecified, not intractable, without status epilepticus: Secondary | ICD-10-CM | POA: Diagnosis not present

## 2022-12-08 DIAGNOSIS — M47816 Spondylosis without myelopathy or radiculopathy, lumbar region: Secondary | ICD-10-CM | POA: Diagnosis not present

## 2022-12-08 DIAGNOSIS — M48061 Spinal stenosis, lumbar region without neurogenic claudication: Secondary | ICD-10-CM | POA: Diagnosis not present

## 2022-12-11 DIAGNOSIS — M47816 Spondylosis without myelopathy or radiculopathy, lumbar region: Secondary | ICD-10-CM | POA: Diagnosis not present

## 2022-12-11 DIAGNOSIS — G40909 Epilepsy, unspecified, not intractable, without status epilepticus: Secondary | ICD-10-CM | POA: Diagnosis not present

## 2022-12-11 DIAGNOSIS — G63 Polyneuropathy in diseases classified elsewhere: Secondary | ICD-10-CM | POA: Diagnosis not present

## 2022-12-11 DIAGNOSIS — G7281 Critical illness myopathy: Secondary | ICD-10-CM | POA: Diagnosis not present

## 2022-12-11 DIAGNOSIS — E531 Pyridoxine deficiency: Secondary | ICD-10-CM | POA: Diagnosis not present

## 2022-12-11 DIAGNOSIS — M48061 Spinal stenosis, lumbar region without neurogenic claudication: Secondary | ICD-10-CM | POA: Diagnosis not present

## 2022-12-19 DIAGNOSIS — M4803 Spinal stenosis, cervicothoracic region: Secondary | ICD-10-CM | POA: Diagnosis not present

## 2022-12-19 DIAGNOSIS — Z6837 Body mass index (BMI) 37.0-37.9, adult: Secondary | ICD-10-CM | POA: Diagnosis not present

## 2022-12-19 DIAGNOSIS — G5601 Carpal tunnel syndrome, right upper limb: Secondary | ICD-10-CM | POA: Diagnosis not present

## 2022-12-19 DIAGNOSIS — G7281 Critical illness myopathy: Secondary | ICD-10-CM | POA: Diagnosis not present

## 2022-12-19 DIAGNOSIS — G40909 Epilepsy, unspecified, not intractable, without status epilepticus: Secondary | ICD-10-CM | POA: Diagnosis not present

## 2022-12-19 DIAGNOSIS — Z9181 History of falling: Secondary | ICD-10-CM | POA: Diagnosis not present

## 2022-12-19 DIAGNOSIS — Z853 Personal history of malignant neoplasm of breast: Secondary | ICD-10-CM | POA: Diagnosis not present

## 2022-12-19 DIAGNOSIS — E531 Pyridoxine deficiency: Secondary | ICD-10-CM | POA: Diagnosis not present

## 2022-12-19 DIAGNOSIS — R634 Abnormal weight loss: Secondary | ICD-10-CM | POA: Diagnosis not present

## 2022-12-19 DIAGNOSIS — N189 Chronic kidney disease, unspecified: Secondary | ICD-10-CM | POA: Diagnosis not present

## 2022-12-19 DIAGNOSIS — M48061 Spinal stenosis, lumbar region without neurogenic claudication: Secondary | ICD-10-CM | POA: Diagnosis not present

## 2022-12-19 DIAGNOSIS — G63 Polyneuropathy in diseases classified elsewhere: Secondary | ICD-10-CM | POA: Diagnosis not present

## 2022-12-19 DIAGNOSIS — M8448XD Pathological fracture, other site, subsequent encounter for fracture with routine healing: Secondary | ICD-10-CM | POA: Diagnosis not present

## 2022-12-19 DIAGNOSIS — M47816 Spondylosis without myelopathy or radiculopathy, lumbar region: Secondary | ICD-10-CM | POA: Diagnosis not present

## 2022-12-19 DIAGNOSIS — Z7401 Bed confinement status: Secondary | ICD-10-CM | POA: Diagnosis not present

## 2022-12-23 DIAGNOSIS — G63 Polyneuropathy in diseases classified elsewhere: Secondary | ICD-10-CM | POA: Diagnosis not present

## 2022-12-23 DIAGNOSIS — G40909 Epilepsy, unspecified, not intractable, without status epilepticus: Secondary | ICD-10-CM | POA: Diagnosis not present

## 2022-12-23 DIAGNOSIS — G7281 Critical illness myopathy: Secondary | ICD-10-CM | POA: Diagnosis not present

## 2022-12-23 DIAGNOSIS — M48061 Spinal stenosis, lumbar region without neurogenic claudication: Secondary | ICD-10-CM | POA: Diagnosis not present

## 2022-12-23 DIAGNOSIS — E531 Pyridoxine deficiency: Secondary | ICD-10-CM | POA: Diagnosis not present

## 2022-12-23 DIAGNOSIS — M47816 Spondylosis without myelopathy or radiculopathy, lumbar region: Secondary | ICD-10-CM | POA: Diagnosis not present

## 2022-12-31 DIAGNOSIS — G7281 Critical illness myopathy: Secondary | ICD-10-CM | POA: Diagnosis not present

## 2022-12-31 DIAGNOSIS — M47816 Spondylosis without myelopathy or radiculopathy, lumbar region: Secondary | ICD-10-CM | POA: Diagnosis not present

## 2022-12-31 DIAGNOSIS — E531 Pyridoxine deficiency: Secondary | ICD-10-CM | POA: Diagnosis not present

## 2022-12-31 DIAGNOSIS — G63 Polyneuropathy in diseases classified elsewhere: Secondary | ICD-10-CM | POA: Diagnosis not present

## 2022-12-31 DIAGNOSIS — M48061 Spinal stenosis, lumbar region without neurogenic claudication: Secondary | ICD-10-CM | POA: Diagnosis not present

## 2022-12-31 DIAGNOSIS — G40909 Epilepsy, unspecified, not intractable, without status epilepticus: Secondary | ICD-10-CM | POA: Diagnosis not present

## 2023-01-05 DIAGNOSIS — M47816 Spondylosis without myelopathy or radiculopathy, lumbar region: Secondary | ICD-10-CM | POA: Diagnosis not present

## 2023-01-05 DIAGNOSIS — M48061 Spinal stenosis, lumbar region without neurogenic claudication: Secondary | ICD-10-CM | POA: Diagnosis not present

## 2023-01-05 DIAGNOSIS — E531 Pyridoxine deficiency: Secondary | ICD-10-CM | POA: Diagnosis not present

## 2023-01-05 DIAGNOSIS — G7281 Critical illness myopathy: Secondary | ICD-10-CM | POA: Diagnosis not present

## 2023-01-05 DIAGNOSIS — G40909 Epilepsy, unspecified, not intractable, without status epilepticus: Secondary | ICD-10-CM | POA: Diagnosis not present

## 2023-01-05 DIAGNOSIS — G63 Polyneuropathy in diseases classified elsewhere: Secondary | ICD-10-CM | POA: Diagnosis not present

## 2023-01-14 DIAGNOSIS — G7281 Critical illness myopathy: Secondary | ICD-10-CM | POA: Diagnosis not present

## 2023-01-14 DIAGNOSIS — M47816 Spondylosis without myelopathy or radiculopathy, lumbar region: Secondary | ICD-10-CM | POA: Diagnosis not present

## 2023-01-14 DIAGNOSIS — E531 Pyridoxine deficiency: Secondary | ICD-10-CM | POA: Diagnosis not present

## 2023-01-14 DIAGNOSIS — G63 Polyneuropathy in diseases classified elsewhere: Secondary | ICD-10-CM | POA: Diagnosis not present

## 2023-01-14 DIAGNOSIS — M48061 Spinal stenosis, lumbar region without neurogenic claudication: Secondary | ICD-10-CM | POA: Diagnosis not present

## 2023-01-14 DIAGNOSIS — G40909 Epilepsy, unspecified, not intractable, without status epilepticus: Secondary | ICD-10-CM | POA: Diagnosis not present

## 2023-01-18 DIAGNOSIS — R634 Abnormal weight loss: Secondary | ICD-10-CM | POA: Diagnosis not present

## 2023-01-18 DIAGNOSIS — G7281 Critical illness myopathy: Secondary | ICD-10-CM | POA: Diagnosis not present

## 2023-01-18 DIAGNOSIS — Z6837 Body mass index (BMI) 37.0-37.9, adult: Secondary | ICD-10-CM | POA: Diagnosis not present

## 2023-01-18 DIAGNOSIS — G63 Polyneuropathy in diseases classified elsewhere: Secondary | ICD-10-CM | POA: Diagnosis not present

## 2023-01-18 DIAGNOSIS — E531 Pyridoxine deficiency: Secondary | ICD-10-CM | POA: Diagnosis not present

## 2023-01-18 DIAGNOSIS — M4803 Spinal stenosis, cervicothoracic region: Secondary | ICD-10-CM | POA: Diagnosis not present

## 2023-01-18 DIAGNOSIS — M8448XD Pathological fracture, other site, subsequent encounter for fracture with routine healing: Secondary | ICD-10-CM | POA: Diagnosis not present

## 2023-01-18 DIAGNOSIS — Z993 Dependence on wheelchair: Secondary | ICD-10-CM | POA: Diagnosis not present

## 2023-01-18 DIAGNOSIS — G40909 Epilepsy, unspecified, not intractable, without status epilepticus: Secondary | ICD-10-CM | POA: Diagnosis not present

## 2023-01-18 DIAGNOSIS — N189 Chronic kidney disease, unspecified: Secondary | ICD-10-CM | POA: Diagnosis not present

## 2023-01-18 DIAGNOSIS — M48061 Spinal stenosis, lumbar region without neurogenic claudication: Secondary | ICD-10-CM | POA: Diagnosis not present

## 2023-01-18 DIAGNOSIS — G5601 Carpal tunnel syndrome, right upper limb: Secondary | ICD-10-CM | POA: Diagnosis not present

## 2023-01-18 DIAGNOSIS — M47816 Spondylosis without myelopathy or radiculopathy, lumbar region: Secondary | ICD-10-CM | POA: Diagnosis not present

## 2023-01-19 DIAGNOSIS — G7281 Critical illness myopathy: Secondary | ICD-10-CM | POA: Diagnosis not present

## 2023-01-19 DIAGNOSIS — Z1339 Encounter for screening examination for other mental health and behavioral disorders: Secondary | ICD-10-CM | POA: Diagnosis not present

## 2023-01-19 DIAGNOSIS — M47816 Spondylosis without myelopathy or radiculopathy, lumbar region: Secondary | ICD-10-CM | POA: Diagnosis not present

## 2023-01-19 DIAGNOSIS — Z6826 Body mass index (BMI) 26.0-26.9, adult: Secondary | ICD-10-CM | POA: Diagnosis not present

## 2023-01-19 DIAGNOSIS — E531 Pyridoxine deficiency: Secondary | ICD-10-CM | POA: Diagnosis not present

## 2023-01-19 DIAGNOSIS — G40909 Epilepsy, unspecified, not intractable, without status epilepticus: Secondary | ICD-10-CM | POA: Diagnosis not present

## 2023-01-19 DIAGNOSIS — E039 Hypothyroidism, unspecified: Secondary | ICD-10-CM | POA: Diagnosis not present

## 2023-01-19 DIAGNOSIS — Z79899 Other long term (current) drug therapy: Secondary | ICD-10-CM | POA: Diagnosis not present

## 2023-01-19 DIAGNOSIS — Z0000 Encounter for general adult medical examination without abnormal findings: Secondary | ICD-10-CM | POA: Diagnosis not present

## 2023-01-19 DIAGNOSIS — G63 Polyneuropathy in diseases classified elsewhere: Secondary | ICD-10-CM | POA: Diagnosis not present

## 2023-01-19 DIAGNOSIS — M48061 Spinal stenosis, lumbar region without neurogenic claudication: Secondary | ICD-10-CM | POA: Diagnosis not present

## 2023-02-04 DIAGNOSIS — M48061 Spinal stenosis, lumbar region without neurogenic claudication: Secondary | ICD-10-CM | POA: Diagnosis not present

## 2023-02-04 DIAGNOSIS — G40909 Epilepsy, unspecified, not intractable, without status epilepticus: Secondary | ICD-10-CM | POA: Diagnosis not present

## 2023-02-04 DIAGNOSIS — G63 Polyneuropathy in diseases classified elsewhere: Secondary | ICD-10-CM | POA: Diagnosis not present

## 2023-02-04 DIAGNOSIS — G7281 Critical illness myopathy: Secondary | ICD-10-CM | POA: Diagnosis not present

## 2023-02-04 DIAGNOSIS — M47816 Spondylosis without myelopathy or radiculopathy, lumbar region: Secondary | ICD-10-CM | POA: Diagnosis not present

## 2023-02-04 DIAGNOSIS — E531 Pyridoxine deficiency: Secondary | ICD-10-CM | POA: Diagnosis not present

## 2023-02-12 DIAGNOSIS — G63 Polyneuropathy in diseases classified elsewhere: Secondary | ICD-10-CM | POA: Diagnosis not present

## 2023-02-12 DIAGNOSIS — M47816 Spondylosis without myelopathy or radiculopathy, lumbar region: Secondary | ICD-10-CM | POA: Diagnosis not present

## 2023-02-12 DIAGNOSIS — E531 Pyridoxine deficiency: Secondary | ICD-10-CM | POA: Diagnosis not present

## 2023-02-12 DIAGNOSIS — M48061 Spinal stenosis, lumbar region without neurogenic claudication: Secondary | ICD-10-CM | POA: Diagnosis not present

## 2023-02-12 DIAGNOSIS — G7281 Critical illness myopathy: Secondary | ICD-10-CM | POA: Diagnosis not present

## 2023-02-12 DIAGNOSIS — G40909 Epilepsy, unspecified, not intractable, without status epilepticus: Secondary | ICD-10-CM | POA: Diagnosis not present

## 2023-02-16 DIAGNOSIS — G7281 Critical illness myopathy: Secondary | ICD-10-CM | POA: Diagnosis not present

## 2023-02-16 DIAGNOSIS — G63 Polyneuropathy in diseases classified elsewhere: Secondary | ICD-10-CM | POA: Diagnosis not present

## 2023-02-16 DIAGNOSIS — M48061 Spinal stenosis, lumbar region without neurogenic claudication: Secondary | ICD-10-CM | POA: Diagnosis not present

## 2023-02-16 DIAGNOSIS — E531 Pyridoxine deficiency: Secondary | ICD-10-CM | POA: Diagnosis not present

## 2023-02-16 DIAGNOSIS — M47816 Spondylosis without myelopathy or radiculopathy, lumbar region: Secondary | ICD-10-CM | POA: Diagnosis not present

## 2023-02-16 DIAGNOSIS — G40909 Epilepsy, unspecified, not intractable, without status epilepticus: Secondary | ICD-10-CM | POA: Diagnosis not present

## 2023-02-17 DIAGNOSIS — E531 Pyridoxine deficiency: Secondary | ICD-10-CM | POA: Diagnosis not present

## 2023-02-17 DIAGNOSIS — G5601 Carpal tunnel syndrome, right upper limb: Secondary | ICD-10-CM | POA: Diagnosis not present

## 2023-02-17 DIAGNOSIS — G7281 Critical illness myopathy: Secondary | ICD-10-CM | POA: Diagnosis not present

## 2023-02-17 DIAGNOSIS — Z993 Dependence on wheelchair: Secondary | ICD-10-CM | POA: Diagnosis not present

## 2023-02-17 DIAGNOSIS — G40909 Epilepsy, unspecified, not intractable, without status epilepticus: Secondary | ICD-10-CM | POA: Diagnosis not present

## 2023-02-17 DIAGNOSIS — M4803 Spinal stenosis, cervicothoracic region: Secondary | ICD-10-CM | POA: Diagnosis not present

## 2023-02-17 DIAGNOSIS — G63 Polyneuropathy in diseases classified elsewhere: Secondary | ICD-10-CM | POA: Diagnosis not present

## 2023-02-17 DIAGNOSIS — M47816 Spondylosis without myelopathy or radiculopathy, lumbar region: Secondary | ICD-10-CM | POA: Diagnosis not present

## 2023-02-17 DIAGNOSIS — M8448XD Pathological fracture, other site, subsequent encounter for fracture with routine healing: Secondary | ICD-10-CM | POA: Diagnosis not present

## 2023-02-17 DIAGNOSIS — N189 Chronic kidney disease, unspecified: Secondary | ICD-10-CM | POA: Diagnosis not present

## 2023-02-17 DIAGNOSIS — Z6837 Body mass index (BMI) 37.0-37.9, adult: Secondary | ICD-10-CM | POA: Diagnosis not present

## 2023-02-17 DIAGNOSIS — M48061 Spinal stenosis, lumbar region without neurogenic claudication: Secondary | ICD-10-CM | POA: Diagnosis not present

## 2023-02-17 DIAGNOSIS — R634 Abnormal weight loss: Secondary | ICD-10-CM | POA: Diagnosis not present

## 2023-02-25 DIAGNOSIS — M47816 Spondylosis without myelopathy or radiculopathy, lumbar region: Secondary | ICD-10-CM | POA: Diagnosis not present

## 2023-02-25 DIAGNOSIS — G7281 Critical illness myopathy: Secondary | ICD-10-CM | POA: Diagnosis not present

## 2023-02-25 DIAGNOSIS — M48061 Spinal stenosis, lumbar region without neurogenic claudication: Secondary | ICD-10-CM | POA: Diagnosis not present

## 2023-02-25 DIAGNOSIS — G40909 Epilepsy, unspecified, not intractable, without status epilepticus: Secondary | ICD-10-CM | POA: Diagnosis not present

## 2023-02-25 DIAGNOSIS — G63 Polyneuropathy in diseases classified elsewhere: Secondary | ICD-10-CM | POA: Diagnosis not present

## 2023-02-25 DIAGNOSIS — E531 Pyridoxine deficiency: Secondary | ICD-10-CM | POA: Diagnosis not present

## 2023-03-03 DIAGNOSIS — M48061 Spinal stenosis, lumbar region without neurogenic claudication: Secondary | ICD-10-CM | POA: Diagnosis not present

## 2023-03-03 DIAGNOSIS — G7281 Critical illness myopathy: Secondary | ICD-10-CM | POA: Diagnosis not present

## 2023-03-03 DIAGNOSIS — G63 Polyneuropathy in diseases classified elsewhere: Secondary | ICD-10-CM | POA: Diagnosis not present

## 2023-03-03 DIAGNOSIS — M47816 Spondylosis without myelopathy or radiculopathy, lumbar region: Secondary | ICD-10-CM | POA: Diagnosis not present

## 2023-03-03 DIAGNOSIS — G40909 Epilepsy, unspecified, not intractable, without status epilepticus: Secondary | ICD-10-CM | POA: Diagnosis not present

## 2023-03-03 DIAGNOSIS — E531 Pyridoxine deficiency: Secondary | ICD-10-CM | POA: Diagnosis not present

## 2023-03-09 DIAGNOSIS — M48061 Spinal stenosis, lumbar region without neurogenic claudication: Secondary | ICD-10-CM | POA: Diagnosis not present

## 2023-03-09 DIAGNOSIS — G7281 Critical illness myopathy: Secondary | ICD-10-CM | POA: Diagnosis not present

## 2023-03-09 DIAGNOSIS — M47816 Spondylosis without myelopathy or radiculopathy, lumbar region: Secondary | ICD-10-CM | POA: Diagnosis not present

## 2023-03-09 DIAGNOSIS — G63 Polyneuropathy in diseases classified elsewhere: Secondary | ICD-10-CM | POA: Diagnosis not present

## 2023-03-09 DIAGNOSIS — E531 Pyridoxine deficiency: Secondary | ICD-10-CM | POA: Diagnosis not present

## 2023-03-09 DIAGNOSIS — G40909 Epilepsy, unspecified, not intractable, without status epilepticus: Secondary | ICD-10-CM | POA: Diagnosis not present

## 2023-03-16 DIAGNOSIS — M48061 Spinal stenosis, lumbar region without neurogenic claudication: Secondary | ICD-10-CM | POA: Diagnosis not present

## 2023-03-16 DIAGNOSIS — G63 Polyneuropathy in diseases classified elsewhere: Secondary | ICD-10-CM | POA: Diagnosis not present

## 2023-03-16 DIAGNOSIS — G7281 Critical illness myopathy: Secondary | ICD-10-CM | POA: Diagnosis not present

## 2023-03-16 DIAGNOSIS — M47816 Spondylosis without myelopathy or radiculopathy, lumbar region: Secondary | ICD-10-CM | POA: Diagnosis not present

## 2023-03-16 DIAGNOSIS — E531 Pyridoxine deficiency: Secondary | ICD-10-CM | POA: Diagnosis not present

## 2023-03-16 DIAGNOSIS — G40909 Epilepsy, unspecified, not intractable, without status epilepticus: Secondary | ICD-10-CM | POA: Diagnosis not present

## 2023-03-19 DIAGNOSIS — R634 Abnormal weight loss: Secondary | ICD-10-CM | POA: Diagnosis not present

## 2023-03-19 DIAGNOSIS — Z993 Dependence on wheelchair: Secondary | ICD-10-CM | POA: Diagnosis not present

## 2023-03-19 DIAGNOSIS — G5601 Carpal tunnel syndrome, right upper limb: Secondary | ICD-10-CM | POA: Diagnosis not present

## 2023-03-19 DIAGNOSIS — M47816 Spondylosis without myelopathy or radiculopathy, lumbar region: Secondary | ICD-10-CM | POA: Diagnosis not present

## 2023-03-19 DIAGNOSIS — M8448XD Pathological fracture, other site, subsequent encounter for fracture with routine healing: Secondary | ICD-10-CM | POA: Diagnosis not present

## 2023-03-19 DIAGNOSIS — G7281 Critical illness myopathy: Secondary | ICD-10-CM | POA: Diagnosis not present

## 2023-03-19 DIAGNOSIS — G63 Polyneuropathy in diseases classified elsewhere: Secondary | ICD-10-CM | POA: Diagnosis not present

## 2023-03-19 DIAGNOSIS — M4803 Spinal stenosis, cervicothoracic region: Secondary | ICD-10-CM | POA: Diagnosis not present

## 2023-03-19 DIAGNOSIS — N189 Chronic kidney disease, unspecified: Secondary | ICD-10-CM | POA: Diagnosis not present

## 2023-03-19 DIAGNOSIS — M48061 Spinal stenosis, lumbar region without neurogenic claudication: Secondary | ICD-10-CM | POA: Diagnosis not present

## 2023-03-19 DIAGNOSIS — G40909 Epilepsy, unspecified, not intractable, without status epilepticus: Secondary | ICD-10-CM | POA: Diagnosis not present

## 2023-03-19 DIAGNOSIS — E531 Pyridoxine deficiency: Secondary | ICD-10-CM | POA: Diagnosis not present

## 2023-03-19 DIAGNOSIS — Z6837 Body mass index (BMI) 37.0-37.9, adult: Secondary | ICD-10-CM | POA: Diagnosis not present

## 2023-03-27 DIAGNOSIS — E531 Pyridoxine deficiency: Secondary | ICD-10-CM | POA: Diagnosis not present

## 2023-03-27 DIAGNOSIS — M48061 Spinal stenosis, lumbar region without neurogenic claudication: Secondary | ICD-10-CM | POA: Diagnosis not present

## 2023-03-27 DIAGNOSIS — G40909 Epilepsy, unspecified, not intractable, without status epilepticus: Secondary | ICD-10-CM | POA: Diagnosis not present

## 2023-03-27 DIAGNOSIS — G7281 Critical illness myopathy: Secondary | ICD-10-CM | POA: Diagnosis not present

## 2023-03-27 DIAGNOSIS — G63 Polyneuropathy in diseases classified elsewhere: Secondary | ICD-10-CM | POA: Diagnosis not present

## 2023-03-27 DIAGNOSIS — M47816 Spondylosis without myelopathy or radiculopathy, lumbar region: Secondary | ICD-10-CM | POA: Diagnosis not present

## 2023-04-02 DIAGNOSIS — G63 Polyneuropathy in diseases classified elsewhere: Secondary | ICD-10-CM | POA: Diagnosis not present

## 2023-04-02 DIAGNOSIS — G40909 Epilepsy, unspecified, not intractable, without status epilepticus: Secondary | ICD-10-CM | POA: Diagnosis not present

## 2023-04-02 DIAGNOSIS — E531 Pyridoxine deficiency: Secondary | ICD-10-CM | POA: Diagnosis not present

## 2023-04-08 DIAGNOSIS — G7281 Critical illness myopathy: Secondary | ICD-10-CM | POA: Diagnosis not present

## 2023-04-08 DIAGNOSIS — M47816 Spondylosis without myelopathy or radiculopathy, lumbar region: Secondary | ICD-10-CM | POA: Diagnosis not present

## 2023-04-08 DIAGNOSIS — M48061 Spinal stenosis, lumbar region without neurogenic claudication: Secondary | ICD-10-CM | POA: Diagnosis not present

## 2023-04-08 DIAGNOSIS — G40909 Epilepsy, unspecified, not intractable, without status epilepticus: Secondary | ICD-10-CM | POA: Diagnosis not present

## 2023-04-08 DIAGNOSIS — E531 Pyridoxine deficiency: Secondary | ICD-10-CM | POA: Diagnosis not present

## 2023-04-08 DIAGNOSIS — G63 Polyneuropathy in diseases classified elsewhere: Secondary | ICD-10-CM | POA: Diagnosis not present

## 2023-09-16 DIAGNOSIS — Z6834 Body mass index (BMI) 34.0-34.9, adult: Secondary | ICD-10-CM | POA: Diagnosis not present

## 2023-09-16 DIAGNOSIS — R140 Abdominal distension (gaseous): Secondary | ICD-10-CM | POA: Diagnosis not present

## 2023-10-16 ENCOUNTER — Inpatient Hospital Stay: Payer: Medicare Other | Admitting: Oncology

## 2023-11-09 NOTE — Progress Notes (Signed)
 [facility]  [address] [place],  [place]  [address] [phone]  Clinic Day:  11/10/2023  Referring physician: Gaither Juba, MD   HISTORY OF PRESENT ILLNESS:  The patient is a 73 y.o. 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 The patient also underwent adjuvant breast radiation to prevent local disease recurrence.  She also took anastrozole for 5 years for her adjuvant endocrine management.  She comes in today for routine follow-up.  Since her last visit, the patient has been doing okay.  As it pertains to her breast cancer, she continues to deny having any breast changes which concern her for disease recurrence.  Of note,due to being in a wheelchair and having limited mobility, she has not been able to get a mammogram since July 2020.  PHYSICAL EXAM:  Blood pressure (!) 161/82, pulse 93, temperature 98 F (36.7 C), temperature source Oral, resp. rate 16, height 5\' 3"  (1.6 m), weight 181 lb (82.1 kg), SpO2 97%. Wt Readings from Last 3 Encounters:  11/10/23 181 lb (82.1 kg)  10/12/20 140 lb (63.5 kg)  06/06/19 236 lb (107 kg)   Body mass index is 32.06 kg/m. Performance status (ECOG): 3 Physical Exam Constitutional:      Appearance: She is ill-appearing.     Comments: Chronically ill-appearing woman in a wheelchair.  HENT:     Mouth/Throat:     Pharynx: Oropharynx is clear. No oropharyngeal exudate.  Cardiovascular:     Rate and Rhythm: Normal rate and regular rhythm.     Heart sounds: No murmur heard.    No friction rub. No gallop.  Pulmonary:     Breath sounds: Normal breath sounds.  Chest:  Breasts:    Right: Normal. No swelling, bleeding, inverted nipple, mass, nipple discharge or skin change.     Left: Normal. No swelling, bleeding, inverted nipple, mass, nipple discharge or skin change.  Abdominal:     General: Bowel sounds are normal. There is no distension.      Palpations: Abdomen is soft. There is no mass.     Tenderness: There is no abdominal tenderness.  Musculoskeletal:        General: No tenderness.     Cervical back: Normal range of motion and neck supple.     Right lower leg: No edema.     Left lower leg: No edema.  Lymphadenopathy:     Cervical: No cervical adenopathy.     Right cervical: No superficial, deep or posterior cervical adenopathy.    Left cervical: No superficial, deep or posterior cervical adenopathy.     Upper Body:     Right upper body: No supraclavicular or axillary adenopathy.     Left upper body: No supraclavicular or axillary adenopathy.     Lower Body: No right inguinal adenopathy. No left inguinal adenopathy.  Skin:    Coloration: Skin is not jaundiced.     Findings: No lesion or rash.  Neurological:     General: No focal deficit present.     Mental Status: She is alert and oriented to person, place, and time. Mental status is at baseline.  Psychiatric:        Mood and Affect: Mood normal.        Behavior: Behavior normal.        Thought Content: Thought content normal.        Judgment: Judgment normal.    ASSESSMENT &  PLAN:  Assessment/Plan:  A 73 y.o. 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  Based upon her clinical breast exam today, the patient remains disease free.  Our office will once again try to get her set up for mammogram in the next few weeks; with her very limited mobility, it may ultimately prove too difficult for her to get this study done.   Otherwise, as she is doing well from a breast cancer perspective, I will see her back in 1 year for repeat clinical assessment. The patient understands all the plans discussed today and is in agreement with them.    Francis Doenges Felicia Horde, MD

## 2023-11-10 ENCOUNTER — Telehealth: Payer: Self-pay | Admitting: Oncology

## 2023-11-10 ENCOUNTER — Inpatient Hospital Stay: Attending: Oncology | Admitting: Oncology

## 2023-11-10 ENCOUNTER — Other Ambulatory Visit: Payer: Self-pay

## 2023-11-10 VITALS — BP 161/82 | HR 93 | Temp 98.0°F | Resp 16 | Ht 63.0 in | Wt 181.0 lb

## 2023-11-10 DIAGNOSIS — Z853 Personal history of malignant neoplasm of breast: Secondary | ICD-10-CM | POA: Diagnosis not present

## 2023-11-10 DIAGNOSIS — Z170 Estrogen receptor positive status [ER+]: Secondary | ICD-10-CM | POA: Diagnosis not present

## 2023-11-10 DIAGNOSIS — C50111 Malignant neoplasm of central portion of right female breast: Secondary | ICD-10-CM | POA: Diagnosis not present

## 2023-11-10 NOTE — Telephone Encounter (Signed)
 Patient has been scheduled for follow-up visit per 11/09/23 LOS.  Pt aware of scheduled appt details.

## 2024-01-26 DIAGNOSIS — G629 Polyneuropathy, unspecified: Secondary | ICD-10-CM | POA: Diagnosis not present

## 2024-01-26 DIAGNOSIS — Z79899 Other long term (current) drug therapy: Secondary | ICD-10-CM | POA: Diagnosis not present

## 2024-01-26 DIAGNOSIS — Z1339 Encounter for screening examination for other mental health and behavioral disorders: Secondary | ICD-10-CM | POA: Diagnosis not present

## 2024-01-26 DIAGNOSIS — E039 Hypothyroidism, unspecified: Secondary | ICD-10-CM | POA: Diagnosis not present

## 2024-01-26 DIAGNOSIS — Z0000 Encounter for general adult medical examination without abnormal findings: Secondary | ICD-10-CM | POA: Diagnosis not present

## 2024-01-26 DIAGNOSIS — Z6826 Body mass index (BMI) 26.0-26.9, adult: Secondary | ICD-10-CM | POA: Diagnosis not present

## 2024-02-16 DIAGNOSIS — M6281 Muscle weakness (generalized): Secondary | ICD-10-CM | POA: Diagnosis not present

## 2024-02-16 DIAGNOSIS — R2681 Unsteadiness on feet: Secondary | ICD-10-CM | POA: Diagnosis not present

## 2024-02-16 DIAGNOSIS — M25562 Pain in left knee: Secondary | ICD-10-CM | POA: Diagnosis not present

## 2024-02-16 DIAGNOSIS — M25561 Pain in right knee: Secondary | ICD-10-CM | POA: Diagnosis not present

## 2024-03-22 DIAGNOSIS — M7918 Myalgia, other site: Secondary | ICD-10-CM | POA: Diagnosis not present

## 2024-03-29 DIAGNOSIS — R2681 Unsteadiness on feet: Secondary | ICD-10-CM | POA: Diagnosis not present

## 2024-03-29 DIAGNOSIS — I10 Essential (primary) hypertension: Secondary | ICD-10-CM | POA: Diagnosis not present

## 2024-03-29 DIAGNOSIS — G629 Polyneuropathy, unspecified: Secondary | ICD-10-CM | POA: Diagnosis not present

## 2024-03-29 DIAGNOSIS — M6281 Muscle weakness (generalized): Secondary | ICD-10-CM | POA: Diagnosis not present

## 2024-11-09 ENCOUNTER — Ambulatory Visit: Admitting: Oncology
